# Patient Record
Sex: Male | Born: 1935 | Race: White | Hispanic: No | Marital: Single | Smoking: Never smoker
Health system: Southern US, Community
[De-identification: ages and names within clinical notes are randomized; demographics above are authoritative.]

## PROBLEM LIST (undated history)

## (undated) DIAGNOSIS — I10 Essential (primary) hypertension: Secondary | ICD-10-CM

## (undated) DIAGNOSIS — D649 Anemia, unspecified: Secondary | ICD-10-CM

## (undated) DIAGNOSIS — M1990 Unspecified osteoarthritis, unspecified site: Secondary | ICD-10-CM

## (undated) DIAGNOSIS — F32A Depression, unspecified: Secondary | ICD-10-CM

## (undated) DIAGNOSIS — H269 Unspecified cataract: Secondary | ICD-10-CM

## (undated) DIAGNOSIS — E785 Hyperlipidemia, unspecified: Secondary | ICD-10-CM

## (undated) DIAGNOSIS — F419 Anxiety disorder, unspecified: Secondary | ICD-10-CM

## (undated) DIAGNOSIS — F329 Major depressive disorder, single episode, unspecified: Secondary | ICD-10-CM

## (undated) HISTORY — PX: TONSILLECTOMY: SUR1361

## (undated) HISTORY — DX: Depression, unspecified: F32.A

## (undated) HISTORY — DX: Essential (primary) hypertension: I10

## (undated) HISTORY — DX: Unspecified cataract: H26.9

## (undated) HISTORY — DX: Unspecified osteoarthritis, unspecified site: M19.90

## (undated) HISTORY — DX: Major depressive disorder, single episode, unspecified: F32.9

## (undated) HISTORY — DX: Anxiety disorder, unspecified: F41.9

## (undated) HISTORY — PX: HERNIA REPAIR: SHX51

## (undated) HISTORY — DX: Anemia, unspecified: D64.9

## (undated) HISTORY — PX: ANTERIOR CRUCIATE LIGAMENT REPAIR: SHX115

---

## 2009-02-16 HISTORY — PX: OTHER SURGICAL HISTORY: SHX169

## 2009-05-02 ENCOUNTER — Ambulatory Visit: Payer: Self-pay | Admitting: Gastroenterology

## 2009-05-02 DIAGNOSIS — D126 Benign neoplasm of colon, unspecified: Secondary | ICD-10-CM | POA: Insufficient documentation

## 2009-05-06 ENCOUNTER — Ambulatory Visit: Payer: Self-pay | Admitting: Gastroenterology

## 2009-05-06 ENCOUNTER — Ambulatory Visit (HOSPITAL_COMMUNITY): Admission: RE | Admit: 2009-05-06 | Discharge: 2009-05-06 | Payer: Self-pay | Admitting: Gastroenterology

## 2010-03-18 NOTE — Assessment & Plan Note (Signed)
Summary: 2004 Simple Adenoma   Visit Type:  Initial Consult Referring Provider:  DonDiego Primary Care Provider:  Janna Arch, M.D.  Chief Complaint:  consult for TCS/Hx polyps.  History of Present Illness: No bleeding or change in bowel habits. First TCS 2004: simple adenoma. No weight loss or change in appetite, nause, vomiting, heratburn, indigestion, abd pain, or diarrhea.  Preventive Screening-Counseling & Management  Alcohol-Tobacco     Smoking Status: never  Current Medications (verified): 1)  Lipitor 40 Mg Tabs (Atorvastatin Calcium) .... Take 1 Tablet By Mouth Once A Day 2)  Asa 81 Mg .... Take 1 Tablet By Mouth Once A Day  Allergies (verified): No Known Drug Allergies  Past History:  Past Medical History: Hyperlipidemia 2004 TCS- 3 MM SIMPLE ADENOMA **[facility] AND [facility], [place], [place]  Past Surgical History: Knee Arthroscopy: torn cruciate ligament L hernia repair  Family History: No FH of Colon Cancer or polyps  Social History: Retired from [employer]. Was living in [place]. Single, no children. Patient has never smoked.  Alcohol Use - yes, rae: beer Smoking Status:  never  Review of Systems       Per HPI otherwise all systems negative. No problems with prep or sedation.  OCT 2010: AST 38 ALT 41 T BILI 0.7 ALK PHOS 76 ALB 4.4  Vital Signs:  Patient profile:   77 year old male Height:      69 inches Weight:      198 pounds BMI:     29.35 Temp:     97.9 degrees F oral Pulse rate:   80 / minute BP sitting:   124 / 80  (left arm) Cuff size:   large  Vitals Entered By: Cloria Spring LPN (May 02, 2009 2:56 PM)  Physical Exam  General:  Well developed, well nourished, no acute distress. Head:  Normocephalic and atraumatic. Eyes:  PERRLA, no icterus. Mouth:  No deformity or lesions. Neck:  Supple; no masses. Lungs:  Clear throughout to auscultation. Heart:  Regular rate and rhythm; no murmurs Abdomen:  Soft, nontender and nondistended.  Normal bowel sounds. Extremities:  No edema or deformities noted. Neurologic:  Alert and  oriented x4;  grossly normal neurologically.  Impression & Recommendations:  Problem # 1:  ADENOMATOUS COLONIC POLYP (ICD-211.3) Assessment Unchanged  TCS MAR 21-Trilyte. OPV as needed.   CC: PCP  Orders: New Patient Level III (04540)

## 2010-03-18 NOTE — Letter (Signed)
Summary: TCS ORDER  TCS ORDER   Imported By: Ave Filter 05/02/2009 15:24:16  _____________________________________________________________________  External Attachment:    Type:   Image     Comment:   External Document

## 2014-04-16 ENCOUNTER — Encounter: Payer: Self-pay | Admitting: Gastroenterology

## 2014-06-08 NOTE — Patient Instructions (Signed)
Your procedure is scheduled on:  Thursday, 06/14/14  Report to [facility] at       [place] [facility].  Call this number if you have problems the morning of surgery: [phone]   Remember:   Do not eat or drink   After Midnight.  Take these medicines the morning of surgery with A SIP OF WATER:    Do not wear jewelry, make-up or nail polish.  Do not wear lotions, powders, or perfumes. You may wear deodorant.  Do not bring valuables to the hospital.  Contacts, dentures or bridgework may not be worn into surgery.     Patients discharged the day of surgery will not be allowed to drive home.  Name and phone number of your driver: driver  Special Instructions: Use eye drops as directed.   Please read over the following fact sheets that you were given: Pain Booklet, Anesthesia Post-op Instructions and Care and Recovery After Surgery    Cataract Surgery  A cataract is a clouding of the lens of the eye. When a lens becomes cloudy, vision is reduced based on the degree and nature of the clouding. Surgery may be needed to improve vision. Surgery removes the cloudy lens and usually replaces it with a substitute lens (intraocular lens, IOL). LET YOUR EYE DOCTOR KNOW ABOUT:  Allergies to food or medicine.   Medicines taken including herbs, eyedrops, over-the-counter medicines, and creams.   Use of steroids (by mouth or creams).   Previous problems with anesthetics or numbing medicine.   History of bleeding problems or blood clots.   Previous surgery.   Other health problems, including diabetes and kidney problems.   Possibility of pregnancy, if this applies.  RISKS AND COMPLICATIONS  Infection.   Inflammation of the eyeball (endophthalmitis) that can spread to both eyes (sympathetic ophthalmia).   Poor wound healing.   If an IOL is inserted, it can later fall out of proper position. This is very uncommon.   Clouding of the part of your eye that holds an IOL in place. This is called an  "after-cataract." These are uncommon, but easily treated.  BEFORE THE PROCEDURE  Do not eat or drink anything except small amounts of water for 8 to 12 before your surgery, or as directed by your caregiver.   Unless you are told otherwise, continue any eyedrops you have been prescribed.   Talk to your primary caregiver about all other medicines that you take (both prescription and non-prescription). In some cases, you may need to stop or change medicines near the time of your surgery. This is most important if you are taking blood-thinning medicine.Do not stop medicines unless you are told to do so.   Arrange for someone to drive you to and from the procedure.   Do not put contact lenses in either eye on the day of your surgery.  PROCEDURE There is more than one method for safely removing a cataract. Your doctor can explain the differences and help determine which is best for you. Phacoemulsification surgery is the most common form of cataract surgery.  An injection is given behind the eye or eyedrops are given to make this a painless procedure.   A small cut (incision) is made on the edge of the clear, dome-shaped surface that covers the front of the eye (cornea).   A tiny probe is painlessly inserted into the eye. This device gives off ultrasound waves that soften and break up the cloudy center of the lens. This makes it  easier for the cloudy lens to be removed by suction.   An IOL may be implanted.   The normal lens of the eye is covered by a clear capsule. Part of that capsule is intentionally left in the eye to support the IOL.   Your surgeon may or may not use stitches to close the incision.  There are other forms of cataract surgery that require a larger incision and stiches to close the eye. This approach is taken in cases where the doctor feels that the cataract cannot be easily removed using phacoemulsification. AFTER THE PROCEDURE  When an IOL is implanted, it does not need  care. It becomes a permanent part of your eye and cannot be seen or felt.   Your doctor will schedule follow-up exams to check on your progress.   Review your other medicines with your doctor to see which can be resumed after surgery.   Use eyedrops or take medicine as prescribed by your doctor.  Document Released: 01/22/2011 Document Reviewed: 01/19/2011 [facility] Patient Information 2012 [place].  PATIENT INSTRUCTIONS POST-ANESTHESIA  IMMEDIATELY FOLLOWING SURGERY:  Do not drive or operate machinery for the first twenty four hours after surgery.  Do not make any important decisions for twenty four hours after surgery or while taking narcotic pain medications or sedatives.  If you develop intractable nausea and vomiting or a severe headache please notify your doctor immediately.  FOLLOW-UP:  Please make an appointment with your surgeon as instructed. You do not need to follow up with anesthesia unless specifically instructed to do so.  WOUND CARE INSTRUCTIONS (if applicable):  Keep a dry clean dressing on the anesthesia/puncture wound site if there is drainage.  Once the wound has quit draining you may leave it open to air.  Generally you should leave the bandage intact for twenty four hours unless there is drainage.  If the epidural site drains for more than 36-48 hours please call the anesthesia department.  QUESTIONS?:  Please feel free to call your physician or the hospital operator if you have any questions, and they will be happy to assist you.

## 2014-06-11 ENCOUNTER — Other Ambulatory Visit: Payer: Self-pay

## 2014-06-11 ENCOUNTER — Encounter (HOSPITAL_COMMUNITY): Payer: Self-pay

## 2014-06-11 ENCOUNTER — Encounter (HOSPITAL_COMMUNITY)
Admission: RE | Admit: 2014-06-11 | Discharge: 2014-06-11 | Disposition: A | Discharge status: Home / Self Care | Payer: Medicare Other | Source: Ambulatory Visit | Attending: Ophthalmology | Admitting: Ophthalmology

## 2014-06-11 DIAGNOSIS — H269 Unspecified cataract: Secondary | ICD-10-CM | POA: Diagnosis present

## 2014-06-11 DIAGNOSIS — Z79899 Other long term (current) drug therapy: Secondary | ICD-10-CM | POA: Diagnosis not present

## 2014-06-11 DIAGNOSIS — E780 Pure hypercholesterolemia: Secondary | ICD-10-CM | POA: Diagnosis not present

## 2014-06-11 DIAGNOSIS — H2511 Age-related nuclear cataract, right eye: Secondary | ICD-10-CM | POA: Diagnosis not present

## 2014-06-11 DIAGNOSIS — Z7982 Long term (current) use of aspirin: Secondary | ICD-10-CM | POA: Diagnosis not present

## 2014-06-11 HISTORY — DX: Hyperlipidemia, unspecified: E78.5

## 2014-06-11 LAB — CBC
HCT: 44.9 % (ref 39.0–52.0)
HEMOGLOBIN: 14.8 g/dL (ref 13.0–17.0)
MCH: 29.7 pg (ref 26.0–34.0)
MCHC: 33 g/dL (ref 30.0–36.0)
MCV: 90 fL (ref 78.0–100.0)
PLATELETS: 243 10*3/uL (ref 150–400)
RBC: 4.99 MIL/uL (ref 4.22–5.81)
RDW: 14.2 % (ref 11.5–15.5)
WBC: 9.4 10*3/uL (ref 4.0–10.5)

## 2014-06-11 LAB — BASIC METABOLIC PANEL
ANION GAP: 7 (ref 5–15)
BUN: 16 mg/dL (ref 6–23)
CO2: 26 mmol/L (ref 19–32)
Calcium: 9.5 mg/dL (ref 8.4–10.5)
Chloride: 108 mmol/L (ref 96–112)
Creatinine, Ser: 0.89 mg/dL (ref 0.50–1.35)
GFR calc Af Amer: >90 mL/min (ref >90)
GFR, EST NON AFRICAN AMERICAN: 80 mL/min — AB (ref >90)
Glucose, Bld: 115 mg/dL — ABNORMAL HIGH (ref 70–99)
POTASSIUM: 4.4 mmol/L (ref 3.5–5.1)
SODIUM: 141 mmol/L (ref 135–145)

## 2014-06-13 MED ORDER — PHENYLEPHRINE HCL 2.5 % OP SOLN
OPHTHALMIC | Status: AC
  Filled 2014-06-13: qty 15

## 2014-06-13 MED ORDER — CYCLOPENTOLATE-PHENYLEPHRINE OP SOLN OPTIME - NO CHARGE
OPHTHALMIC | Status: AC
  Filled 2014-06-13: qty 2

## 2014-06-13 MED ORDER — NEOMYCIN-POLYMYXIN-DEXAMETH 3.5-10000-0.1 OP SUSP
OPHTHALMIC | Status: AC
  Filled 2014-06-13: qty 5

## 2014-06-13 MED ORDER — TETRACAINE HCL 0.5 % OP SOLN
OPHTHALMIC | Status: AC
  Filled 2014-06-13: qty 2

## 2014-06-13 MED ORDER — LIDOCAINE HCL 3.5 % OP GEL
OPHTHALMIC | Status: AC
  Filled 2014-06-13: qty 1

## 2014-06-13 MED ORDER — LIDOCAINE HCL (PF) 1 % IJ SOLN
INTRAMUSCULAR | Status: AC
  Filled 2014-06-13: qty 2

## 2014-06-14 ENCOUNTER — Encounter (HOSPITAL_COMMUNITY): Payer: Self-pay | Admitting: *Deleted

## 2014-06-14 ENCOUNTER — Ambulatory Visit (HOSPITAL_COMMUNITY): Payer: Medicare Other | Admitting: Anesthesiology

## 2014-06-14 ENCOUNTER — Encounter (HOSPITAL_COMMUNITY)
Admission: RE | Disposition: A | Discharge status: Home / Self Care | Payer: Self-pay | Source: Ambulatory Visit | Attending: Ophthalmology

## 2014-06-14 ENCOUNTER — Ambulatory Visit (HOSPITAL_COMMUNITY)
Admission: RE | Admit: 2014-06-14 | Discharge: 2014-06-14 | Disposition: A | Discharge status: Home / Self Care | Payer: Medicare Other | Source: Ambulatory Visit | Attending: Ophthalmology | Admitting: Ophthalmology

## 2014-06-14 DIAGNOSIS — E780 Pure hypercholesterolemia: Secondary | ICD-10-CM | POA: Insufficient documentation

## 2014-06-14 DIAGNOSIS — H2511 Age-related nuclear cataract, right eye: Secondary | ICD-10-CM | POA: Diagnosis not present

## 2014-06-14 DIAGNOSIS — Z7982 Long term (current) use of aspirin: Secondary | ICD-10-CM | POA: Diagnosis not present

## 2014-06-14 DIAGNOSIS — Z79899 Other long term (current) drug therapy: Secondary | ICD-10-CM | POA: Diagnosis not present

## 2014-06-14 HISTORY — PX: CATARACT EXTRACTION W/PHACO: SHX586

## 2014-06-14 SURGERY — PHACOEMULSIFICATION, CATARACT, WITH IOL INSERTION
Anesthesia: Monitor Anesthesia Care | Site: Eye | Laterality: Right

## 2014-06-14 MED ORDER — PHENYLEPHRINE HCL 2.5 % OP SOLN
1.0000 [drp] | OPHTHALMIC | Status: AC
  Administered 2014-06-14 (×3): 1 [drp] via OPHTHALMIC

## 2014-06-14 MED ORDER — POVIDONE-IODINE 5 % OP SOLN
OPHTHALMIC | Status: DC | PRN
  Administered 2014-06-14: 1 via OPHTHALMIC

## 2014-06-14 MED ORDER — TETRACAINE HCL 0.5 % OP SOLN
1.0000 [drp] | OPHTHALMIC | Status: AC
  Administered 2014-06-14 (×3): 1 [drp] via OPHTHALMIC

## 2014-06-14 MED ORDER — LIDOCAINE HCL 3.5 % OP GEL
1.0000 "application " | Freq: Once | OPHTHALMIC | Status: AC
  Administered 2014-06-14: 1 via OPHTHALMIC

## 2014-06-14 MED ORDER — LACTATED RINGERS IV SOLN
INTRAVENOUS | Status: DC
Start: 2014-06-14 — End: 2014-06-14
  Administered 2014-06-14: 07:00:00 via INTRAVENOUS

## 2014-06-14 MED ORDER — CYCLOPENTOLATE-PHENYLEPHRINE 0.2-1 % OP SOLN
1.0000 [drp] | OPHTHALMIC | Status: AC
  Administered 2014-06-14 (×3): 1 [drp] via OPHTHALMIC

## 2014-06-14 MED ORDER — PROVISC 10 MG/ML IO SOLN
INTRAOCULAR | Status: DC | PRN
  Administered 2014-06-14: 0.85 mL via INTRAOCULAR

## 2014-06-14 MED ORDER — BSS IO SOLN
INTRAOCULAR | Status: DC | PRN
  Administered 2014-06-14: 15 mL

## 2014-06-14 MED ORDER — FENTANYL CITRATE (PF) 100 MCG/2ML IJ SOLN
INTRAMUSCULAR | Status: AC
  Filled 2014-06-14: qty 2

## 2014-06-14 MED ORDER — NEOMYCIN-POLYMYXIN-DEXAMETH 3.5-10000-0.1 OP SUSP
OPHTHALMIC | Status: DC | PRN
Start: 2014-06-14 — End: 2014-06-14
  Administered 2014-06-14: 2 [drp] via OPHTHALMIC

## 2014-06-14 MED ORDER — MIDAZOLAM HCL 2 MG/2ML IJ SOLN
INTRAMUSCULAR | Status: AC
  Filled 2014-06-14: qty 2

## 2014-06-14 MED ORDER — FENTANYL CITRATE (PF) 100 MCG/2ML IJ SOLN
25.0000 ug | INTRAMUSCULAR | Status: AC
  Administered 2014-06-14: 25 ug via INTRAVENOUS

## 2014-06-14 MED ORDER — EPINEPHRINE HCL 1 MG/ML IJ SOLN
INTRAMUSCULAR | Status: DC | PRN
  Administered 2014-06-14: 500 mL

## 2014-06-14 MED ORDER — EPINEPHRINE HCL 1 MG/ML IJ SOLN
INTRAMUSCULAR | Status: AC
  Filled 2014-06-14: qty 1

## 2014-06-14 MED ORDER — LIDOCAINE HCL (PF) 1 % IJ SOLN
INTRAMUSCULAR | Status: DC | PRN
  Administered 2014-06-14: .5 mL

## 2014-06-14 MED ORDER — MIDAZOLAM HCL 2 MG/2ML IJ SOLN
1.0000 mg | INTRAMUSCULAR | Status: DC | PRN
  Administered 2014-06-14: 2 mg via INTRAVENOUS

## 2014-06-14 SURGICAL SUPPLY — 33 items
CAPSULAR TENSION RING-AMO (OPHTHALMIC RELATED) IMPLANT
CLOTH BEACON ORANGE TIMEOUT ST (SAFETY) ×2 IMPLANT
EYE SHIELD UNIVERSAL CLEAR (GAUZE/BANDAGES/DRESSINGS) ×2 IMPLANT
GLOVE BIO SURGEON STRL SZ 6.5 (GLOVE) IMPLANT
GLOVE BIOGEL PI IND STRL 6.5 (GLOVE) ×1 IMPLANT
GLOVE BIOGEL PI IND STRL 7.0 (GLOVE) IMPLANT
GLOVE BIOGEL PI IND STRL 7.5 (GLOVE) IMPLANT
GLOVE BIOGEL PI INDICATOR 6.5 (GLOVE) ×1
GLOVE BIOGEL PI INDICATOR 7.0 (GLOVE)
GLOVE BIOGEL PI INDICATOR 7.5 (GLOVE)
GLOVE ECLIPSE 6.5 STRL STRAW (GLOVE) IMPLANT
GLOVE ECLIPSE 7.0 STRL STRAW (GLOVE) IMPLANT
GLOVE ECLIPSE 7.5 STRL STRAW (GLOVE) IMPLANT
GLOVE EXAM NITRILE LRG STRL (GLOVE) IMPLANT
GLOVE EXAM NITRILE MD LF STRL (GLOVE) ×2 IMPLANT
GLOVE SKINSENSE NS SZ6.5 (GLOVE)
GLOVE SKINSENSE NS SZ7.0 (GLOVE)
GLOVE SKINSENSE STRL SZ6.5 (GLOVE) IMPLANT
GLOVE SKINSENSE STRL SZ7.0 (GLOVE) IMPLANT
KIT VITRECTOMY (OPHTHALMIC RELATED) IMPLANT
PAD ARMBOARD 7.5X6 YLW CONV (MISCELLANEOUS) ×2 IMPLANT
PROC W NO LENS (INTRAOCULAR LENS)
PROC W SPEC LENS (INTRAOCULAR LENS)
PROCESS W NO LENS (INTRAOCULAR LENS) IMPLANT
PROCESS W SPEC LENS (INTRAOCULAR LENS) IMPLANT
RETRACTOR IRIS SIGHTPATH (OPHTHALMIC RELATED) IMPLANT
RING MALYGIN (MISCELLANEOUS) IMPLANT
SIGHTPATH CAT PROC W REG LENS (Ophthalmic Related) ×2 IMPLANT
SYRINGE LUER LOK 1CC (MISCELLANEOUS) ×2 IMPLANT
TAPE SURG TRANSPORE 1 IN (GAUZE/BANDAGES/DRESSINGS) ×1 IMPLANT
TAPE SURGICAL TRANSPORE 1 IN (GAUZE/BANDAGES/DRESSINGS) ×1
VISCOELASTIC ADDITIONAL (OPHTHALMIC RELATED) IMPLANT
WATER STERILE IRR 250ML POUR (IV SOLUTION) ×2 IMPLANT

## 2014-06-14 NOTE — Transfer of Care (Signed)
Immediate Anesthesia Transfer of Care Note  Patient: Shawn Gomez  Procedure(s) Performed: Procedure(s) with comments: CATARACT EXTRACTION PHACO AND INTRAOCULAR LENS PLACEMENT RIGHT EYE (Right) - CDE 5.47  Patient Location: Short Stay  Anesthesia Type:MAC  Level of Consciousness: awake, alert , oriented and patient cooperative  Airway & Oxygen Therapy: Patient Spontanous Breathing  Post-op Assessment: Report given to RN, Post -op Vital signs reviewed and stable and Patient moving all extremities  Post vital signs: Reviewed and stable  Last Vitals:  Filed Vitals:   06/14/14 0710  BP: 155/73  Pulse:   Temp:   Resp: 24    Complications: No apparent anesthesia complications

## 2014-06-14 NOTE — Op Note (Signed)
Date of Admission: 06/14/2014  Date of Surgery: 06/14/2014   Pre-Op Dx: Cataract Right Eye  Post-Op Dx: Senile Nuclear Cataract Right  Eye,  Dx Code H25.11  Surgeon: Tonny Branch, M.D.  Assistants: None  Anesthesia: Topical with MAC  Indications: Painless, progressive loss of vision with compromise of daily activities.  Surgery: Cataract Extraction with Intraocular lens Implant Right Eye  Discription: The patient had dilating drops and viscous lidocaine placed into the Right eye in the pre-op holding area. After transfer to the operating room, a time out was performed. The patient was then prepped and draped. Beginning with a 97 degree blade a paracentesis port was made at the surgeon's 2 o'clock position. The anterior chamber was then filled with 1% non-preserved lidocaine. This was followed by filling the anterior chamber with Provisc.  A 2.53mm keratome blade was used to make a clear corneal incision at the temporal limbus.  A bent cystatome needle was used to create a continuous tear capsulotomy. Hydrodissection was performed with balanced salt solution on a Fine canula. The lens nucleus was then removed using the phacoemulsification handpiece. Residual cortex was removed with the I&A handpiece. The anterior chamber and capsular bag were refilled with Provisc. A posterior chamber intraocular lens was placed into the capsular bag with it's injector. The implant was positioned with the Kuglan hook. The Provisc was then removed from the anterior chamber and capsular bag with the I&A handpiece. Stromal hydration of the main incision and paracentesis port was performed with BSS on a Fine canula. The wounds were tested for leak which was negative. The patient tolerated the procedure well. There were no operative complications. The patient was then transferred to the recovery room in stable condition.  Complications: None  Specimen: None  EBL: None  Prosthetic device: Hoya iSert 250, power 18.0 D,  SN [id number].

## 2014-06-14 NOTE — Discharge Instructions (Signed)

## 2014-06-14 NOTE — Anesthesia Postprocedure Evaluation (Signed)
  Anesthesia Post-op Note  Patient: Shawn Gomez  Procedure(s) Performed: Procedure(s) with comments: CATARACT EXTRACTION PHACO AND INTRAOCULAR LENS PLACEMENT RIGHT EYE (Right) - CDE 5.47  Patient Location: Short Stay  Anesthesia Type:MAC  Level of Consciousness: awake, alert , oriented and patient cooperative  Airway and Oxygen Therapy: Patient Spontanous Breathing  Post-op Pain: none  Post-op Assessment: Post-op Vital signs reviewed, Patient's Cardiovascular Status Stable, Respiratory Function Stable, Patent Airway, Pain level controlled and No headache  Post-op Vital Signs: Reviewed and stable  Last Vitals:  Filed Vitals:   06/14/14 0710  BP: 155/73  Pulse:   Temp:   Resp: 24    Complications: No apparent anesthesia complications

## 2014-06-14 NOTE — H&P (Signed)
I have reviewed the H&P, the patient was re-examined, and I have identified no interval changes in medical condition and plan of care since the history and physical of record  

## 2014-06-14 NOTE — Anesthesia Preprocedure Evaluation (Signed)
Anesthesia Evaluation  Patient identified by MRN, date of birth, ID band Patient awake    Reviewed: Allergy & Precautions, NPO status , Patient's Chart, lab work & pertinent test results  Airway Mallampati: II  TM Distance: >3 FB     Dental  (+) Teeth Intact   Pulmonary neg pulmonary ROS,    breath sounds clear to auscultation       Cardiovascular negative cardio ROS   Rhythm:Regular Rate:Normal     Neuro/Psych    GI/Hepatic negative GI ROS,   Endo/Other    Renal/GU      Musculoskeletal   Abdominal   Peds  Hematology   Anesthesia Other Findings   Reproductive/Obstetrics                             Anesthesia Physical Anesthesia Plan  ASA: II  Anesthesia Plan: MAC   Post-op Pain Management:    Induction: Intravenous  Airway Management Planned: Nasal Cannula  Additional Equipment:   Intra-op Plan:   Post-operative Plan:   Informed Consent: I have reviewed the patients History and Physical, chart, labs and discussed the procedure including the risks, benefits and alternatives for the proposed anesthesia with the patient or authorized representative who has indicated his/her understanding and acceptance.     Plan Discussed with:   Anesthesia Plan Comments:         Anesthesia Quick Evaluation  

## 2014-06-15 ENCOUNTER — Encounter (HOSPITAL_COMMUNITY): Payer: Self-pay | Admitting: Ophthalmology

## 2014-07-09 ENCOUNTER — Encounter (HOSPITAL_COMMUNITY): Payer: Self-pay

## 2014-07-09 ENCOUNTER — Encounter (HOSPITAL_COMMUNITY)
Admission: RE | Admit: 2014-07-09 | Discharge: 2014-07-09 | Disposition: A | Discharge status: Home / Self Care | Payer: Medicare Other | Source: Ambulatory Visit | Attending: Ophthalmology | Admitting: Ophthalmology

## 2014-07-11 MED ORDER — LIDOCAINE HCL (PF) 1 % IJ SOLN
INTRAMUSCULAR | Status: AC
  Filled 2014-07-11: qty 2

## 2014-07-11 MED ORDER — NEOMYCIN-POLYMYXIN-DEXAMETH 3.5-10000-0.1 OP SUSP
OPHTHALMIC | Status: AC
  Filled 2014-07-11: qty 5

## 2014-07-11 MED ORDER — PHENYLEPHRINE HCL 2.5 % OP SOLN
OPHTHALMIC | Status: AC
  Filled 2014-07-11: qty 15

## 2014-07-11 MED ORDER — CYCLOPENTOLATE-PHENYLEPHRINE OP SOLN OPTIME - NO CHARGE
OPHTHALMIC | Status: AC
  Filled 2014-07-11: qty 2

## 2014-07-11 MED ORDER — TETRACAINE HCL 0.5 % OP SOLN
OPHTHALMIC | Status: AC
  Filled 2014-07-11: qty 2

## 2014-07-11 MED ORDER — LIDOCAINE HCL 3.5 % OP GEL
OPHTHALMIC | Status: AC
  Filled 2014-07-11: qty 1

## 2014-07-12 ENCOUNTER — Encounter (HOSPITAL_COMMUNITY)
Admission: RE | Disposition: A | Discharge status: Home / Self Care | Payer: Self-pay | Source: Ambulatory Visit | Attending: Ophthalmology

## 2014-07-12 ENCOUNTER — Ambulatory Visit (HOSPITAL_COMMUNITY): Payer: Medicare Other | Admitting: Anesthesiology

## 2014-07-12 ENCOUNTER — Encounter (HOSPITAL_COMMUNITY): Payer: Self-pay | Admitting: *Deleted

## 2014-07-12 ENCOUNTER — Ambulatory Visit (HOSPITAL_COMMUNITY)
Admission: RE | Admit: 2014-07-12 | Discharge: 2014-07-12 | Disposition: A | Discharge status: Home / Self Care | Payer: Medicare Other | Source: Ambulatory Visit | Attending: Ophthalmology | Admitting: Ophthalmology

## 2014-07-12 DIAGNOSIS — H25812 Combined forms of age-related cataract, left eye: Secondary | ICD-10-CM | POA: Diagnosis not present

## 2014-07-12 HISTORY — PX: CATARACT EXTRACTION W/PHACO: SHX586

## 2014-07-12 SURGERY — PHACOEMULSIFICATION, CATARACT, WITH IOL INSERTION
Anesthesia: Monitor Anesthesia Care | Site: Eye | Laterality: Left

## 2014-07-12 MED ORDER — MIDAZOLAM HCL 2 MG/2ML IJ SOLN
1.0000 mg | INTRAMUSCULAR | Status: DC | PRN
  Administered 2014-07-12: 2 mg via INTRAVENOUS

## 2014-07-12 MED ORDER — PHENYLEPHRINE HCL 2.5 % OP SOLN
1.0000 [drp] | OPHTHALMIC | Status: AC
  Administered 2014-07-12 (×3): 1 [drp] via OPHTHALMIC

## 2014-07-12 MED ORDER — BSS IO SOLN
INTRAOCULAR | Status: DC | PRN
  Administered 2014-07-12: 15 mL via INTRAOCULAR

## 2014-07-12 MED ORDER — FENTANYL CITRATE (PF) 100 MCG/2ML IJ SOLN
25.0000 ug | INTRAMUSCULAR | Status: AC
  Administered 2014-07-12 (×2): 25 ug via INTRAVENOUS

## 2014-07-12 MED ORDER — LIDOCAINE HCL 3.5 % OP GEL
1.0000 "application " | Freq: Once | OPHTHALMIC | Status: AC
  Administered 2014-07-12: 1 via OPHTHALMIC

## 2014-07-12 MED ORDER — MIDAZOLAM HCL 2 MG/2ML IJ SOLN
INTRAMUSCULAR | Status: AC
  Filled 2014-07-12: qty 2

## 2014-07-12 MED ORDER — EPINEPHRINE HCL 1 MG/ML IJ SOLN
INTRAMUSCULAR | Status: AC
  Filled 2014-07-12: qty 1

## 2014-07-12 MED ORDER — CYCLOPENTOLATE-PHENYLEPHRINE 0.2-1 % OP SOLN
1.0000 [drp] | OPHTHALMIC | Status: AC
  Administered 2014-07-12 (×3): 1 [drp] via OPHTHALMIC

## 2014-07-12 MED ORDER — LIDOCAINE 3.5 % OP GEL OPTIME - NO CHARGE
OPHTHALMIC | Status: DC | PRN
  Administered 2014-07-12: 1 [drp] via OPHTHALMIC

## 2014-07-12 MED ORDER — NEOMYCIN-POLYMYXIN-DEXAMETH 3.5-10000-0.1 OP SUSP
OPHTHALMIC | Status: DC | PRN
  Administered 2014-07-12: 1 [drp] via OPHTHALMIC

## 2014-07-12 MED ORDER — FENTANYL CITRATE (PF) 100 MCG/2ML IJ SOLN
INTRAMUSCULAR | Status: AC
  Filled 2014-07-12: qty 2

## 2014-07-12 MED ORDER — PROVISC 10 MG/ML IO SOLN
INTRAOCULAR | Status: DC | PRN
  Administered 2014-07-12: 0.85 mL via INTRAOCULAR

## 2014-07-12 MED ORDER — LACTATED RINGERS IV SOLN
INTRAVENOUS | Status: DC
  Administered 2014-07-12: 11:00:00 via INTRAVENOUS

## 2014-07-12 MED ORDER — TETRACAINE HCL 0.5 % OP SOLN
1.0000 [drp] | OPHTHALMIC | Status: AC
  Administered 2014-07-12 (×3): 1 [drp] via OPHTHALMIC

## 2014-07-12 MED ORDER — LIDOCAINE HCL (PF) 1 % IJ SOLN
INTRAMUSCULAR | Status: DC | PRN
  Administered 2014-07-12: .5 mL

## 2014-07-12 MED ORDER — POVIDONE-IODINE 5 % OP SOLN
OPHTHALMIC | Status: DC | PRN
  Administered 2014-07-12: 1 via OPHTHALMIC

## 2014-07-12 MED ORDER — EPINEPHRINE HCL 1 MG/ML IJ SOLN
INTRAMUSCULAR | Status: DC | PRN
  Administered 2014-07-12: 500 mL

## 2014-07-12 SURGICAL SUPPLY — 33 items
CAPSULAR TENSION RING-AMO (OPHTHALMIC RELATED) IMPLANT
CLOTH BEACON ORANGE TIMEOUT ST (SAFETY) ×2 IMPLANT
EYE SHIELD UNIVERSAL CLEAR (GAUZE/BANDAGES/DRESSINGS) ×2 IMPLANT
GLOVE BIO SURGEON STRL SZ 6.5 (GLOVE) IMPLANT
GLOVE BIOGEL PI IND STRL 6.5 (GLOVE) IMPLANT
GLOVE BIOGEL PI IND STRL 7.0 (GLOVE) ×1 IMPLANT
GLOVE BIOGEL PI IND STRL 7.5 (GLOVE) IMPLANT
GLOVE BIOGEL PI INDICATOR 6.5 (GLOVE)
GLOVE BIOGEL PI INDICATOR 7.0 (GLOVE) ×1
GLOVE BIOGEL PI INDICATOR 7.5 (GLOVE)
GLOVE ECLIPSE 6.5 STRL STRAW (GLOVE) IMPLANT
GLOVE ECLIPSE 7.0 STRL STRAW (GLOVE) IMPLANT
GLOVE ECLIPSE 7.5 STRL STRAW (GLOVE) IMPLANT
GLOVE EXAM NITRILE LRG STRL (GLOVE) ×2 IMPLANT
GLOVE EXAM NITRILE MD LF STRL (GLOVE) IMPLANT
GLOVE SKINSENSE NS SZ6.5 (GLOVE)
GLOVE SKINSENSE NS SZ7.0 (GLOVE)
GLOVE SKINSENSE STRL SZ6.5 (GLOVE) IMPLANT
GLOVE SKINSENSE STRL SZ7.0 (GLOVE) IMPLANT
KIT VITRECTOMY (OPHTHALMIC RELATED) IMPLANT
PAD ARMBOARD 7.5X6 YLW CONV (MISCELLANEOUS) ×2 IMPLANT
PROC W NO LENS (INTRAOCULAR LENS)
PROC W SPEC LENS (INTRAOCULAR LENS)
PROCESS W NO LENS (INTRAOCULAR LENS) IMPLANT
PROCESS W SPEC LENS (INTRAOCULAR LENS) IMPLANT
RETRACTOR IRIS SIGHTPATH (OPHTHALMIC RELATED) IMPLANT
RING MALYGIN (MISCELLANEOUS) IMPLANT
SIGHTPATH CAT PROC W REG LENS (Ophthalmic Related) ×2 IMPLANT
SYRINGE LUER LOK 1CC (MISCELLANEOUS) ×2 IMPLANT
TAPE SURG TRANSPORE 1 IN (GAUZE/BANDAGES/DRESSINGS) ×1 IMPLANT
TAPE SURGICAL TRANSPORE 1 IN (GAUZE/BANDAGES/DRESSINGS) ×1
VISCOELASTIC ADDITIONAL (OPHTHALMIC RELATED) IMPLANT
WATER STERILE IRR 250ML POUR (IV SOLUTION) ×2 IMPLANT

## 2014-07-12 NOTE — Op Note (Signed)
Date of Admission: 07/12/2014  Date of Surgery: 07/12/2014   Pre-Op Dx: Cataract Left Eye  Post-Op Dx: Senile Combined Cataract Left  Eye,  Dx Code E10.071  Surgeon: Tonny Branch, M.D.  Assistants: None  Anesthesia: Topical with MAC  Indications: Painless, progressive loss of vision with compromise of daily activities.  Surgery: Cataract Extraction with Intraocular lens Implant Left Eye  Discription: The patient had dilating drops and viscous lidocaine placed into the Left eye in the pre-op holding area. After transfer to the operating room, a time out was performed. The patient was then prepped and draped. Beginning with a 68 degree blade a paracentesis port was made at the surgeon's 2 o'clock position. The anterior chamber was then filled with 1% non-preserved lidocaine. This was followed by filling the anterior chamber with Provisc.  A 2.83mm keratome blade was used to make a clear corneal incision at the temporal limbus.  A bent cystatome needle was used to create a continuous tear capsulotomy. Hydrodissection was performed with balanced salt solution on a Fine canula. The lens nucleus was then removed using the phacoemulsification handpiece. Residual cortex was removed with the I&A handpiece. The anterior chamber and capsular bag were refilled with Provisc. A posterior chamber intraocular lens was placed into the capsular bag with it's injector. The implant was positioned with the Kuglan hook. The Provisc was then removed from the anterior chamber and capsular bag with the I&A handpiece. Stromal hydration of the main incision and paracentesis port was performed with BSS on a Fine canula. The wounds were tested for leak which was negative. The patient tolerated the procedure well. There were no operative complications. The patient was then transferred to the recovery room in stable condition.  Complications: None  Specimen: None  EBL: None  Prosthetic device: Hoya iSert 250, power 18.0 D, SN  [id number].

## 2014-07-12 NOTE — Anesthesia Procedure Notes (Signed)
Procedure Name: MAC Date/Time: 07/12/2014 12:10 PM Performed by: Michele Rockers Pre-anesthesia Checklist: Patient identified, Emergency Drugs available, Suction available, Timeout performed and Patient being monitored Patient Re-evaluated:Patient Re-evaluated prior to inductionOxygen Delivery Method: Nasal Cannula

## 2014-07-12 NOTE — Anesthesia Postprocedure Evaluation (Signed)
  Anesthesia Post-op Note  Patient: Shawn Gomez  Procedure(s) Performed: Procedure(s): CATARACT EXTRACTION PHACO AND INTRAOCULAR LENS PLACEMENT: CDE:  5.63 (Left)  Patient Location: PACU and Short Stay  Anesthesia Type:MAC  Level of Consciousness: awake, alert , oriented, patient cooperative and responds to stimulation  Airway and Oxygen Therapy: Patient Spontanous Breathing  Post-op Pain: none  Post-op Assessment: Post-op Vital signs reviewed, Patient's Cardiovascular Status Stable, Respiratory Function Stable, Patent Airway, No signs of Nausea or vomiting and Pain level controlled  Post-op Vital Signs: Reviewed and stable  Last Vitals:  Filed Vitals:   07/12/14 1200  BP: 115/65  Pulse:   Temp:   Resp: 13    Complications: No apparent anesthesia complications

## 2014-07-12 NOTE — Discharge Instructions (Signed)

## 2014-07-12 NOTE — H&P (Signed)
I have reviewed the H&P, the patient was re-examined, and I have identified no interval changes in medical condition and plan of care since the history and physical of record  

## 2014-07-12 NOTE — Transfer of Care (Signed)
Immediate Anesthesia Transfer of Care Note  Patient: Shawn Gomez  Procedure(s) Performed: Procedure(s): CATARACT EXTRACTION PHACO AND INTRAOCULAR LENS PLACEMENT: CDE:  5.63 (Left)  Patient Location: Short Stay  Anesthesia Type:MAC  Level of Consciousness: awake, alert , oriented, patient cooperative and responds to stimulation  Airway & Oxygen Therapy: Patient Spontanous Breathing  Post-op Assessment: Report given to RN, Post -op Vital signs reviewed and stable and Patient moving all extremities X 4  Post vital signs: Reviewed and stable  Last Vitals:  Filed Vitals:   07/12/14 1200  BP: 115/65  Pulse:   Temp:   Resp: 13    Complications: No apparent anesthesia complications

## 2014-07-12 NOTE — Anesthesia Preprocedure Evaluation (Signed)
Anesthesia Evaluation  Patient identified by MRN, date of birth, ID band Patient awake    Reviewed: Allergy & Precautions, NPO status , Patient's Chart, lab work & pertinent test results  Airway Mallampati: II  TM Distance: >3 FB     Dental  (+) Teeth Intact   Pulmonary neg pulmonary ROS,    breath sounds clear to auscultation       Cardiovascular negative cardio ROS   Rhythm:Regular Rate:Normal     Neuro/Psych    GI/Hepatic negative GI ROS,   Endo/Other    Renal/GU      Musculoskeletal   Abdominal   Peds  Hematology   Anesthesia Other Findings   Reproductive/Obstetrics                             Anesthesia Physical Anesthesia Plan  ASA: II  Anesthesia Plan: MAC   Post-op Pain Management:    Induction: Intravenous  Airway Management Planned: Nasal Cannula  Additional Equipment:   Intra-op Plan:   Post-operative Plan:   Informed Consent: I have reviewed the patients History and Physical, chart, labs and discussed the procedure including the risks, benefits and alternatives for the proposed anesthesia with the patient or authorized representative who has indicated his/her understanding and acceptance.     Plan Discussed with:   Anesthesia Plan Comments:         Anesthesia Quick Evaluation  

## 2014-07-13 ENCOUNTER — Encounter (HOSPITAL_COMMUNITY): Payer: Self-pay | Admitting: Ophthalmology

## 2014-07-13 NOTE — Addendum Note (Signed)
Addendum  created 07/13/14 1228 by Mickel Baas, CRNA   Modules edited: Charges VN

## 2015-01-02 ENCOUNTER — Ambulatory Visit (INDEPENDENT_AMBULATORY_CARE_PROVIDER_SITE_OTHER): Payer: Medicare Other | Admitting: Urology

## 2015-01-02 DIAGNOSIS — R972 Elevated prostate specific antigen [PSA]: Secondary | ICD-10-CM | POA: Diagnosis not present

## 2015-01-02 DIAGNOSIS — N400 Enlarged prostate without lower urinary tract symptoms: Secondary | ICD-10-CM

## 2015-04-10 ENCOUNTER — Ambulatory Visit: Payer: Medicare Other | Admitting: Urology

## 2015-04-17 ENCOUNTER — Ambulatory Visit (INDEPENDENT_AMBULATORY_CARE_PROVIDER_SITE_OTHER): Payer: Medicare Other | Admitting: Urology

## 2015-04-17 DIAGNOSIS — R972 Elevated prostate specific antigen [PSA]: Secondary | ICD-10-CM | POA: Diagnosis not present

## 2015-04-17 DIAGNOSIS — N400 Enlarged prostate without lower urinary tract symptoms: Secondary | ICD-10-CM

## 2015-07-11 ENCOUNTER — Inpatient Hospital Stay (HOSPITAL_COMMUNITY): Payer: Medicare Other | Admitting: Anesthesiology

## 2015-07-11 ENCOUNTER — Inpatient Hospital Stay (HOSPITAL_COMMUNITY): Payer: Medicare Other

## 2015-07-11 ENCOUNTER — Encounter (HOSPITAL_COMMUNITY)
Admission: EM | Disposition: A | Discharge status: Skilled Nursing Facility | Payer: Self-pay | Source: Home / Self Care | Attending: Family Medicine

## 2015-07-11 ENCOUNTER — Encounter (HOSPITAL_COMMUNITY): Payer: Self-pay

## 2015-07-11 ENCOUNTER — Inpatient Hospital Stay (HOSPITAL_COMMUNITY)
Admission: EM | Admit: 2015-07-11 | Discharge: 2015-07-14 | DRG: 470 | Disposition: A | Discharge status: Skilled Nursing Facility | Payer: Medicare Other | Attending: Family Medicine | Admitting: Family Medicine

## 2015-07-11 ENCOUNTER — Emergency Department (HOSPITAL_COMMUNITY): Payer: Medicare Other

## 2015-07-11 DIAGNOSIS — D72829 Elevated white blood cell count, unspecified: Secondary | ICD-10-CM | POA: Diagnosis present

## 2015-07-11 DIAGNOSIS — S72001A Fracture of unspecified part of neck of right femur, initial encounter for closed fracture: Secondary | ICD-10-CM | POA: Diagnosis not present

## 2015-07-11 DIAGNOSIS — Z7982 Long term (current) use of aspirin: Secondary | ICD-10-CM | POA: Diagnosis not present

## 2015-07-11 DIAGNOSIS — F319 Bipolar disorder, unspecified: Secondary | ICD-10-CM | POA: Diagnosis present

## 2015-07-11 DIAGNOSIS — F0390 Unspecified dementia without behavioral disturbance: Secondary | ICD-10-CM | POA: Diagnosis not present

## 2015-07-11 DIAGNOSIS — S39012A Strain of muscle, fascia and tendon of lower back, initial encounter: Secondary | ICD-10-CM | POA: Diagnosis not present

## 2015-07-11 DIAGNOSIS — E785 Hyperlipidemia, unspecified: Secondary | ICD-10-CM | POA: Diagnosis present

## 2015-07-11 DIAGNOSIS — F428 Other obsessive-compulsive disorder: Secondary | ICD-10-CM | POA: Diagnosis present

## 2015-07-11 DIAGNOSIS — I10 Essential (primary) hypertension: Secondary | ICD-10-CM | POA: Diagnosis present

## 2015-07-11 DIAGNOSIS — Z79899 Other long term (current) drug therapy: Secondary | ICD-10-CM | POA: Diagnosis not present

## 2015-07-11 DIAGNOSIS — W010XXA Fall on same level from slipping, tripping and stumbling without subsequent striking against object, initial encounter: Secondary | ICD-10-CM | POA: Diagnosis present

## 2015-07-11 DIAGNOSIS — Z419 Encounter for procedure for purposes other than remedying health state, unspecified: Secondary | ICD-10-CM

## 2015-07-11 DIAGNOSIS — Z96641 Presence of right artificial hip joint: Secondary | ICD-10-CM

## 2015-07-11 HISTORY — PX: TOTAL HIP ARTHROPLASTY: SHX124

## 2015-07-11 LAB — CBC
HEMATOCRIT: 36.7 % — AB (ref 39.0–52.0)
Hemoglobin: 11.9 g/dL — ABNORMAL LOW (ref 13.0–17.0)
MCH: 28.7 pg (ref 26.0–34.0)
MCHC: 32.4 g/dL (ref 30.0–36.0)
MCV: 88.4 fL (ref 78.0–100.0)
PLATELETS: 263 10*3/uL (ref 150–400)
RBC: 4.15 MIL/uL — ABNORMAL LOW (ref 4.22–5.81)
RDW: 13.9 % (ref 11.5–15.5)
WBC: 18.2 10*3/uL — ABNORMAL HIGH (ref 4.0–10.5)

## 2015-07-11 LAB — CBC WITH DIFFERENTIAL/PLATELET
BASOS ABS: 0 10*3/uL (ref 0.0–0.1)
BASOS PCT: 0 %
EOS ABS: 0 10*3/uL (ref 0.0–0.7)
Eosinophils Relative: 0 %
HCT: 41.1 % (ref 39.0–52.0)
HEMOGLOBIN: 13.3 g/dL (ref 13.0–17.0)
Lymphocytes Relative: 5 %
Lymphs Abs: 1.3 10*3/uL (ref 0.7–4.0)
MCH: 28.9 pg (ref 26.0–34.0)
MCHC: 32.4 g/dL (ref 30.0–36.0)
MCV: 89.3 fL (ref 78.0–100.0)
Monocytes Absolute: 0.5 10*3/uL (ref 0.1–1.0)
Monocytes Relative: 2 %
NEUTROS PCT: 93 %
Neutro Abs: 23.7 10*3/uL — ABNORMAL HIGH (ref 1.7–7.7)
PLATELETS: 291 10*3/uL (ref 150–400)
RBC: 4.6 MIL/uL (ref 4.22–5.81)
RDW: 13.9 % (ref 11.5–15.5)
WBC: 25.5 10*3/uL — AB (ref 4.0–10.5)

## 2015-07-11 LAB — BASIC METABOLIC PANEL
Anion gap: 9 (ref 5–15)
BUN: 33 mg/dL — ABNORMAL HIGH (ref 6–20)
CALCIUM: 9.5 mg/dL (ref 8.9–10.3)
CO2: 21 mmol/L — AB (ref 22–32)
Chloride: 107 mmol/L (ref 101–111)
Creatinine, Ser: 1.26 mg/dL — ABNORMAL HIGH (ref 0.61–1.24)
GFR calc Af Amer: >60 mL/min (ref >60)
GFR, EST NON AFRICAN AMERICAN: 52 mL/min — AB (ref >60)
GLUCOSE: 127 mg/dL — AB (ref 65–99)
Potassium: 4.1 mmol/L (ref 3.5–5.1)
Sodium: 137 mmol/L (ref 135–145)

## 2015-07-11 LAB — CREATININE, SERUM
Creatinine, Ser: 1.16 mg/dL (ref 0.61–1.24)
GFR calc Af Amer: >60 mL/min (ref >60)
GFR calc non Af Amer: 58 mL/min — ABNORMAL LOW (ref >60)

## 2015-07-11 LAB — TYPE AND SCREEN
ABO/RH(D): A NEG
ANTIBODY SCREEN: NEGATIVE

## 2015-07-11 LAB — CBG MONITORING, ED: GLUCOSE-CAPILLARY: 83 mg/dL (ref 65–99)

## 2015-07-11 LAB — PROTIME-INR
INR: 1 (ref 0.00–1.49)
PROTHROMBIN TIME: 13.4 s (ref 11.6–15.2)

## 2015-07-11 LAB — SURGICAL PCR SCREEN
MRSA, PCR: NEGATIVE
Staphylococcus aureus: NEGATIVE

## 2015-07-11 SURGERY — ARTHROPLASTY, HIP, TOTAL, ANTERIOR APPROACH
Anesthesia: General | Site: Hip | Laterality: Right

## 2015-07-11 MED ORDER — FENTANYL CITRATE (PF) 100 MCG/2ML IJ SOLN
50.0000 ug | Freq: Once | INTRAMUSCULAR | Status: AC
  Administered 2015-07-11: 50 ug via INTRAVENOUS
  Filled 2015-07-11: qty 2

## 2015-07-11 MED ORDER — ASPIRIN EC 325 MG PO TBEC
325.0000 mg | DELAYED_RELEASE_TABLET | Freq: Two times a day (BID) | ORAL | Status: DC
  Administered 2015-07-12 – 2015-07-14 (×5): 325 mg via ORAL
  Filled 2015-07-11 (×5): qty 1

## 2015-07-11 MED ORDER — MORPHINE SULFATE (PF) 2 MG/ML IV SOLN: 0.5000 mg | INTRAVENOUS | Status: DC | PRN

## 2015-07-11 MED ORDER — FENTANYL CITRATE (PF) 100 MCG/2ML IJ SOLN
100.0000 ug | Freq: Once | INTRAMUSCULAR | Status: AC
  Administered 2015-07-11: 100 ug via INTRAVENOUS
  Filled 2015-07-11: qty 2

## 2015-07-11 MED ORDER — PROPOFOL 10 MG/ML IV BOLUS
INTRAVENOUS | Status: AC
  Filled 2015-07-11: qty 20

## 2015-07-11 MED ORDER — SODIUM CHLORIDE 0.9 % IV SOLN
INTRAVENOUS | Status: DC
  Administered 2015-07-12: 75 mL/h via INTRAVENOUS

## 2015-07-11 MED ORDER — SUCCINYLCHOLINE CHLORIDE 20 MG/ML IJ SOLN
INTRAMUSCULAR | Status: DC | PRN
  Administered 2015-07-11: 100 mg via INTRAVENOUS

## 2015-07-11 MED ORDER — HYDROCODONE-ACETAMINOPHEN 5-325 MG PO TABS: 1.0000 | ORAL_TABLET | Freq: Four times a day (QID) | ORAL | Status: DC | PRN

## 2015-07-11 MED ORDER — CEFAZOLIN SODIUM-DEXTROSE 2-4 GM/100ML-% IV SOLN
2.0000 g | Freq: Four times a day (QID) | INTRAVENOUS | Status: AC
  Administered 2015-07-12 (×2): 2 g via INTRAVENOUS
  Filled 2015-07-11 (×2): qty 100

## 2015-07-11 MED ORDER — METOCLOPRAMIDE HCL 5 MG PO TABS: 5.0000 mg | ORAL_TABLET | Freq: Three times a day (TID) | ORAL | Status: DC | PRN

## 2015-07-11 MED ORDER — ROCURONIUM BROMIDE 50 MG/5ML IV SOLN
INTRAVENOUS | Status: AC
  Filled 2015-07-11: qty 1

## 2015-07-11 MED ORDER — ONDANSETRON HCL 4 MG/2ML IJ SOLN
4.0000 mg | Freq: Once | INTRAMUSCULAR | Status: AC
  Administered 2015-07-11: 4 mg via INTRAVENOUS
  Filled 2015-07-11: qty 2

## 2015-07-11 MED ORDER — ONDANSETRON HCL 4 MG/2ML IJ SOLN: 4.0000 mg | Freq: Once | INTRAMUSCULAR | Status: DC | PRN

## 2015-07-11 MED ORDER — HYDROMORPHONE HCL 1 MG/ML IJ SOLN: 1.0000 mg | INTRAMUSCULAR | Status: DC | PRN

## 2015-07-11 MED ORDER — ZOLPIDEM TARTRATE 5 MG PO TABS
5.0000 mg | ORAL_TABLET | Freq: Every evening | ORAL | Status: DC | PRN
  Administered 2015-07-12 – 2015-07-13 (×2): 5 mg via ORAL
  Filled 2015-07-11 (×2): qty 1

## 2015-07-11 MED ORDER — PROPOFOL 10 MG/ML IV BOLUS
INTRAVENOUS | Status: DC | PRN
  Administered 2015-07-11: 100 mg via INTRAVENOUS
  Administered 2015-07-11: 30 mg via INTRAVENOUS
  Administered 2015-07-11: 20 mg via INTRAVENOUS

## 2015-07-11 MED ORDER — PHENYLEPHRINE HCL 10 MG/ML IJ SOLN
10.0000 mg | INTRAVENOUS | Status: DC | PRN
  Administered 2015-07-11: 50 ug/min via INTRAVENOUS

## 2015-07-11 MED ORDER — ONDANSETRON HCL 4 MG/2ML IJ SOLN: 4.0000 mg | Freq: Four times a day (QID) | INTRAMUSCULAR | Status: DC | PRN

## 2015-07-11 MED ORDER — METOCLOPRAMIDE HCL 5 MG/ML IJ SOLN: 5.0000 mg | Freq: Three times a day (TID) | INTRAMUSCULAR | Status: DC | PRN

## 2015-07-11 MED ORDER — ATORVASTATIN CALCIUM 40 MG PO TABS
40.0000 mg | ORAL_TABLET | Freq: Every day | ORAL | Status: DC
  Administered 2015-07-12 – 2015-07-14 (×3): 40 mg via ORAL
  Filled 2015-07-11 (×3): qty 1

## 2015-07-11 MED ORDER — SUGAMMADEX SODIUM 200 MG/2ML IV SOLN
INTRAVENOUS | Status: AC
  Filled 2015-07-11: qty 2

## 2015-07-11 MED ORDER — DOCUSATE SODIUM 100 MG PO CAPS: 100.0000 mg | ORAL_CAPSULE | Freq: Two times a day (BID) | ORAL | Status: DC

## 2015-07-11 MED ORDER — POVIDONE-IODINE 10 % EX SWAB: 2.0000 "application " | Freq: Once | CUTANEOUS | Status: DC

## 2015-07-11 MED ORDER — OXYCODONE HCL 5 MG PO TABS
5.0000 mg | ORAL_TABLET | ORAL | Status: DC | PRN
  Administered 2015-07-12: 5 mg via ORAL
  Filled 2015-07-11: qty 1

## 2015-07-11 MED ORDER — SUGAMMADEX SODIUM 200 MG/2ML IV SOLN
INTRAVENOUS | Status: DC | PRN
  Administered 2015-07-11: 200 mg via INTRAVENOUS

## 2015-07-11 MED ORDER — ENOXAPARIN SODIUM 40 MG/0.4ML ~~LOC~~ SOLN: 40.0000 mg | SUBCUTANEOUS | Status: DC

## 2015-07-11 MED ORDER — 0.9 % SODIUM CHLORIDE (POUR BTL) OPTIME
TOPICAL | Status: DC | PRN
  Administered 2015-07-11: 1000 mL

## 2015-07-11 MED ORDER — ASPIRIN EC 81 MG PO TBEC: 81.0000 mg | DELAYED_RELEASE_TABLET | Freq: Every day | ORAL | Status: DC

## 2015-07-11 MED ORDER — LISINOPRIL 20 MG PO TABS
20.0000 mg | ORAL_TABLET | Freq: Every day | ORAL | Status: DC
  Administered 2015-07-12: 20 mg via ORAL
  Filled 2015-07-11 (×3): qty 1

## 2015-07-11 MED ORDER — ONDANSETRON HCL 4 MG/2ML IJ SOLN
INTRAMUSCULAR | Status: DC | PRN
  Administered 2015-07-11: 4 mg via INTRAVENOUS

## 2015-07-11 MED ORDER — SUCCINYLCHOLINE CHLORIDE 200 MG/10ML IV SOSY
PREFILLED_SYRINGE | INTRAVENOUS | Status: AC
  Filled 2015-07-11: qty 10

## 2015-07-11 MED ORDER — METHOCARBAMOL 1000 MG/10ML IJ SOLN
500.0000 mg | Freq: Four times a day (QID) | INTRAVENOUS | Status: DC | PRN
  Filled 2015-07-11: qty 5

## 2015-07-11 MED ORDER — SODIUM CHLORIDE 0.9 % IV BOLUS (SEPSIS)
500.0000 mL | Freq: Once | INTRAVENOUS | Status: AC
  Administered 2015-07-11: 500 mL via INTRAVENOUS

## 2015-07-11 MED ORDER — ACETAMINOPHEN 325 MG PO TABS: 650.0000 mg | ORAL_TABLET | Freq: Four times a day (QID) | ORAL | Status: DC | PRN

## 2015-07-11 MED ORDER — METHOCARBAMOL 500 MG PO TABS: 500.0000 mg | ORAL_TABLET | Freq: Four times a day (QID) | ORAL | Status: DC | PRN

## 2015-07-11 MED ORDER — EPHEDRINE 5 MG/ML INJ
INTRAVENOUS | Status: AC
  Filled 2015-07-11: qty 10

## 2015-07-11 MED ORDER — DOCUSATE SODIUM 100 MG PO CAPS
100.0000 mg | ORAL_CAPSULE | Freq: Two times a day (BID) | ORAL | Status: DC
  Administered 2015-07-12 – 2015-07-13 (×3): 100 mg via ORAL
  Filled 2015-07-11 (×3): qty 1

## 2015-07-11 MED ORDER — LIDOCAINE HCL (CARDIAC) 20 MG/ML IV SOLN
INTRAVENOUS | Status: DC | PRN
  Administered 2015-07-11: 5 mL via INTRATRACHEAL

## 2015-07-11 MED ORDER — PHENOL 1.4 % MT LIQD: 1.0000 | OROMUCOSAL | Status: DC | PRN

## 2015-07-11 MED ORDER — CEFAZOLIN SODIUM-DEXTROSE 2-4 GM/100ML-% IV SOLN
2.0000 g | INTRAVENOUS | Status: DC
  Filled 2015-07-11: qty 100

## 2015-07-11 MED ORDER — LACTATED RINGERS IV SOLN
INTRAVENOUS | Status: DC | PRN
  Administered 2015-07-11 (×2): via INTRAVENOUS

## 2015-07-11 MED ORDER — FENTANYL CITRATE (PF) 100 MCG/2ML IJ SOLN
INTRAMUSCULAR | Status: DC | PRN
Start: 2015-07-11 — End: 2015-07-11
  Administered 2015-07-11: 100 ug via INTRAVENOUS

## 2015-07-11 MED ORDER — CHLORHEXIDINE GLUCONATE 4 % EX LIQD
60.0000 mL | Freq: Once | CUTANEOUS | Status: DC
  Filled 2015-07-11: qty 60

## 2015-07-11 MED ORDER — SODIUM CHLORIDE 0.9 % IV SOLN
INTRAVENOUS | Status: DC
Start: 2015-07-11 — End: 2015-07-12

## 2015-07-11 MED ORDER — EPHEDRINE SULFATE 50 MG/ML IJ SOLN
INTRAMUSCULAR | Status: DC | PRN
  Administered 2015-07-11: 10 mg via INTRAVENOUS

## 2015-07-11 MED ORDER — ALUM & MAG HYDROXIDE-SIMETH 200-200-20 MG/5ML PO SUSP: 30.0000 mL | ORAL | Status: DC | PRN

## 2015-07-11 MED ORDER — ONDANSETRON HCL 4 MG/2ML IJ SOLN
INTRAMUSCULAR | Status: AC
  Filled 2015-07-11: qty 6

## 2015-07-11 MED ORDER — SODIUM CHLORIDE 0.9 % IR SOLN
Status: DC | PRN
  Administered 2015-07-11: 3000 mL

## 2015-07-11 MED ORDER — FENTANYL CITRATE (PF) 100 MCG/2ML IJ SOLN
25.0000 ug | INTRAMUSCULAR | Status: DC | PRN
  Administered 2015-07-11 (×2): 50 ug via INTRAVENOUS

## 2015-07-11 MED ORDER — ONDANSETRON HCL 4 MG PO TABS: 4.0000 mg | ORAL_TABLET | Freq: Four times a day (QID) | ORAL | Status: DC | PRN

## 2015-07-11 MED ORDER — FENTANYL CITRATE (PF) 250 MCG/5ML IJ SOLN
INTRAMUSCULAR | Status: AC
  Filled 2015-07-11: qty 5

## 2015-07-11 MED ORDER — ACETAMINOPHEN 650 MG RE SUPP: 650.0000 mg | Freq: Four times a day (QID) | RECTAL | Status: DC | PRN

## 2015-07-11 MED ORDER — FENTANYL CITRATE (PF) 100 MCG/2ML IJ SOLN
INTRAMUSCULAR | Status: AC
  Filled 2015-07-11: qty 2

## 2015-07-11 MED ORDER — CEFAZOLIN SODIUM-DEXTROSE 2-3 GM-% IV SOLR
INTRAVENOUS | Status: DC | PRN
  Administered 2015-07-11: 2 g via INTRAVENOUS

## 2015-07-11 MED ORDER — MENTHOL 3 MG MT LOZG: 1.0000 | LOZENGE | OROMUCOSAL | Status: DC | PRN

## 2015-07-11 MED ORDER — ROCURONIUM BROMIDE 100 MG/10ML IV SOLN
INTRAVENOUS | Status: DC | PRN
  Administered 2015-07-11: 40 mg via INTRAVENOUS

## 2015-07-11 SURGICAL SUPPLY — 60 items
BENZOIN TINCTURE PRP APPL 2/3 (GAUZE/BANDAGES/DRESSINGS) ×3 IMPLANT
BLADE SAW SGTL 18X1.27X75 (BLADE) ×2 IMPLANT
BLADE SAW SGTL 18X1.27X75MM (BLADE) ×1
BLADE SURG ROTATE 9660 (MISCELLANEOUS) ×3 IMPLANT
CAPT HIP TOTAL 2 ×3 IMPLANT
CELLS DAT CNTRL 66122 CELL SVR (MISCELLANEOUS) ×1 IMPLANT
CLOSURE WOUND 1/2 X4 (GAUZE/BANDAGES/DRESSINGS) ×1
COVER SURGICAL LIGHT HANDLE (MISCELLANEOUS) ×3 IMPLANT
DRAPE C-ARM 42X72 X-RAY (DRAPES) ×3 IMPLANT
DRAPE STERI IOBAN 125X83 (DRAPES) ×3 IMPLANT
DRAPE U-SHAPE 47X51 STRL (DRAPES) ×9 IMPLANT
DRSG AQUACEL AG ADV 3.5X10 (GAUZE/BANDAGES/DRESSINGS) ×3 IMPLANT
DURAPREP 26ML APPLICATOR (WOUND CARE) ×3 IMPLANT
ELECT BLADE 4.0 EZ CLEAN MEGAD (MISCELLANEOUS)
ELECT BLADE 6.5 EXT (BLADE) IMPLANT
ELECT REM PT RETURN 9FT ADLT (ELECTROSURGICAL) ×3
ELECTRODE BLDE 4.0 EZ CLN MEGD (MISCELLANEOUS) IMPLANT
ELECTRODE REM PT RTRN 9FT ADLT (ELECTROSURGICAL) ×1 IMPLANT
FACESHIELD WRAPAROUND (MASK) ×6 IMPLANT
GLOVE BIOGEL PI IND STRL 6.5 (GLOVE) ×1 IMPLANT
GLOVE BIOGEL PI IND STRL 7.0 (GLOVE) ×1 IMPLANT
GLOVE BIOGEL PI IND STRL 8 (GLOVE) ×2 IMPLANT
GLOVE BIOGEL PI INDICATOR 6.5 (GLOVE) ×2
GLOVE BIOGEL PI INDICATOR 7.0 (GLOVE) ×2
GLOVE BIOGEL PI INDICATOR 8 (GLOVE) ×4
GLOVE BIOGEL PI ORTHO PRO 7.5 (GLOVE) ×2
GLOVE ECLIPSE 8.0 STRL XLNG CF (GLOVE) ×3 IMPLANT
GLOVE ORTHO TXT STRL SZ7.5 (GLOVE) ×6 IMPLANT
GLOVE PI ORTHO PRO STRL 7.5 (GLOVE) ×1 IMPLANT
GLOVE SKINSENSE NS SZ6.5 (GLOVE) ×4
GLOVE SKINSENSE NS SZ7.0 (GLOVE) ×2
GLOVE SKINSENSE STRL SZ6.5 (GLOVE) ×2 IMPLANT
GLOVE SKINSENSE STRL SZ7.0 (GLOVE) ×1 IMPLANT
GOWN STRL REUS W/ TWL LRG LVL3 (GOWN DISPOSABLE) ×3 IMPLANT
GOWN STRL REUS W/ TWL XL LVL3 (GOWN DISPOSABLE) ×3 IMPLANT
GOWN STRL REUS W/TWL LRG LVL3 (GOWN DISPOSABLE) ×6
GOWN STRL REUS W/TWL XL LVL3 (GOWN DISPOSABLE) ×6
HANDPIECE INTERPULSE COAX TIP (DISPOSABLE) ×2
KIT BASIN OR (CUSTOM PROCEDURE TRAY) ×3 IMPLANT
KIT ROOM TURNOVER OR (KITS) ×3 IMPLANT
MANIFOLD NEPTUNE II (INSTRUMENTS) ×3 IMPLANT
NS IRRIG 1000ML POUR BTL (IV SOLUTION) ×3 IMPLANT
PACK TOTAL JOINT (CUSTOM PROCEDURE TRAY) ×3 IMPLANT
PAD ARMBOARD 7.5X6 YLW CONV (MISCELLANEOUS) ×6 IMPLANT
RTRCTR WOUND ALEXIS 18CM MED (MISCELLANEOUS) ×3
SET HNDPC FAN SPRY TIP SCT (DISPOSABLE) ×1 IMPLANT
STAPLER VISISTAT 35W (STAPLE) ×3 IMPLANT
STRIP CLOSURE SKIN 1/2X4 (GAUZE/BANDAGES/DRESSINGS) ×2 IMPLANT
SUT ETHIBOND NAB CT1 #1 30IN (SUTURE) ×3 IMPLANT
SUT MNCRL AB 4-0 PS2 18 (SUTURE) ×3 IMPLANT
SUT VIC AB 0 CT1 27 (SUTURE) ×2
SUT VIC AB 0 CT1 27XBRD ANBCTR (SUTURE) ×1 IMPLANT
SUT VIC AB 1 CT1 27 (SUTURE) ×2
SUT VIC AB 1 CT1 27XBRD ANBCTR (SUTURE) ×1 IMPLANT
SUT VIC AB 2-0 CT1 27 (SUTURE) ×4
SUT VIC AB 2-0 CT1 TAPERPNT 27 (SUTURE) ×2 IMPLANT
TOWEL OR 17X24 6PK STRL BLUE (TOWEL DISPOSABLE) ×3 IMPLANT
TOWEL OR 17X26 10 PK STRL BLUE (TOWEL DISPOSABLE) ×3 IMPLANT
TRAY FOLEY CATH 16FRSI W/METER (SET/KITS/TRAYS/PACK) IMPLANT
WATER STERILE IRR 1000ML POUR (IV SOLUTION) ×6 IMPLANT

## 2015-07-11 NOTE — ED Provider Notes (Signed)
CSN: [id number]     Arrival date & time 07/11/15  1334 History   First MD Initiated Contact with Patient 07/11/15 1348     Chief Complaint  Patient presents with  . Hip Pain  . Fall     Patient is a 82 y.o. male presenting with hip pain and fall. The history is provided by the patient.  Hip Pain This is a new problem. The current episode started 3 to 5 hours ago. The problem occurs constantly. The problem has been gradually worsening. Pertinent negatives include no chest pain, no abdominal pain, no headaches and no shortness of breath. The symptoms are aggravated by walking. The symptoms are relieved by rest.  Fall This is a new problem. The current episode started 3 to 5 hours ago. Pertinent negatives include no chest pain, no abdominal pain, no headaches and no shortness of breath.  Patient with h/o hyperlipidemia presents s/p fall about 3 hrs ago He was getting laundry out of his car when he accidentally fell (denies any weakness/dizziness prior to fall) and landed on right hip  He reports pain in right hip and low back He had some numbness to right LE but that has resolved No head injury No LOC He has no other complaints  Past Medical History  Diagnosis Date  . Hyperlipidemia    Past Surgical History  Procedure Laterality Date  . Anterior cruciate ligament repair Left   . Hernia repair Left     Inguinal  . Cataract extraction w/phaco Right 06/14/2014    Procedure: CATARACT EXTRACTION PHACO AND INTRAOCULAR LENS PLACEMENT RIGHT EYE;  Surgeon: Tonny Branch, MD;  Location: [facility];  Service: Ophthalmology;  Laterality: Right;  CDE 5.47  . Cataract extraction w/phaco Left 07/12/2014    Procedure: CATARACT EXTRACTION PHACO AND INTRAOCULAR LENS PLACEMENT: CDE:  5.63;  Surgeon: Tonny Branch, MD;  Location: [facility];  Service: Ophthalmology;  Laterality: Left;   Family History  Problem Relation Age of Onset  . Stroke Mother   . Seizures Father    Social History  Substance Use Topics  .  Smoking status: Never Smoker   . Smokeless tobacco: Never Used  . Alcohol Use: Yes     Comment: Occ    Review of Systems  Constitutional: Negative for fever.  Respiratory: Negative for shortness of breath.   Cardiovascular: Negative for chest pain.  Gastrointestinal: Negative for abdominal pain.  Musculoskeletal: Positive for back pain and arthralgias. Negative for neck pain.  Neurological: Negative for headaches.  All other systems reviewed and are negative.     Allergies  Review of patient's allergies indicates no known allergies.  Home Medications   Prior to Admission medications   Medication Sig Start Date End Date Taking? Authorizing Provider  aspirin 81 MG tablet Take 81 mg by mouth daily.    Historical Provider, MD  atorvastatin (LIPITOR) 40 MG tablet Take 40 mg by mouth daily.    Historical Provider, MD  ketorolac (ACULAR) 0.5 % ophthalmic solution Place 1 drop into both eyes as directed. before procedure 06/17/14   Historical Provider, MD  ofloxacin (OCUFLOX) 0.3 % ophthalmic solution Place 1 drop into the left eye 4 (four) times daily.    Historical Provider, MD  prednisoLONE acetate (PRED FORTE) 1 % ophthalmic suspension Place 1 drop into both eyes as directed. Before procedure. 06/17/14   Historical Provider, MD   BP 150/71 mmHg  Pulse 85  Temp(Src) 98.2 F (36.8 C) (Oral)  Resp 18  Ht 5'  9" (1.753 m)  Wt 79.833 kg  BMI 25.98 kg/m2  SpO2 99% Physical Exam CONSTITUTIONAL: Well developed/well nourished HEAD: Normocephalic/atraumatic EYES: EOMI/PERRL ENMT: Mucous membranes moist NECK: supple no meningeal signs SPINE/BACK:no cervical spine tenderness CV: S1/S2 noted, no murmurs/rubs/gallops noted LUNGS: Lungs are clear to auscultation bilaterally, no apparent distress ABDOMEN: soft, nontender NEURO: Pt is awake/alert/appropriate, moves all extremitiesx4.  No facial droop.   EXTREMITIES: pulses normal/equal in feet, tenderness with ROM of right hip, All other  extremities/joints palpated/ranged and nontender SKIN: warm, color normal PSYCH: no abnormalities of mood noted, alert and oriented to situation  ED Course  Procedures  Medications  fentaNYL (SUBLIMAZE) injection 100 mcg (100 mcg Intravenous Given 07/11/15 1418)   2:39 PM Pt with right hip pain s/p fall Imaging ordered Also reports back pain, imaging ordered D/w niece, she reports pt has had increasing memory loss recently and concern for possible dementia 4:06 PM Pt with right femoral neck fx D/w dr Luna Glasgow on call for dr Aline Brochure He requests transfer to [facility] as the OR will be shut down this weekend D/w dr Gerald Stabs blackman, recommend transfer to [facility] to hospitalist service Keep patient NPO 4:30 PM D/w dr Clementeen Graham, he will arrange for transport/admission to [place] - Abnormal; Notable for the following:    CO2 21 (*)    Glucose, Bld 127 (*)    BUN 33 (*)    Creatinine, Ser 1.26 (*)    GFR calc non Af Amer 52 (*)    All other components within normal limits  CBC WITH DIFFERENTIAL/PLATELET - Abnormal; Notable for the following:    WBC 25.5 (*)    Neutro Abs 23.7 (*)    All other components within normal limits  PROTIME-INR  TYPE AND SCREEN    Imaging Review Dg Chest 1 View  07/11/2015  CLINICAL DATA:  Right hip and low back pain after slipping and falling on a wet surface outside today. EXAM: CHEST 1 VIEW COMPARISON:  None. FINDINGS: The heart size and mediastinal contours are within normal limits. Both lungs are clear. The visualized skeletal structures are unremarkable. IMPRESSION: Normal examination. Electronically Signed   By: Claudie Revering M.D.   On: 07/11/2015 15:14   Dg Lumbar Spine Complete  07/11/2015  CLINICAL DATA:  Low back and right hip pain after slipping and falling on a wet surface today. EXAM: LUMBAR SPINE - COMPLETE 4+ VIEW COMPARISON:  None. FINDINGS: Five non-rib-bearing lumbar vertebrae. Mild to moderate  lateral spur formation at multiple levels. Mild to moderate anterior spur formation at the L3-4 level. No fractures, subluxations or pars defects are seen. IMPRESSION: 1. No fracture or subluxation. 2. Degenerative changes. Electronically Signed   By: Claudie Revering M.D.   On: 07/11/2015 15:21   Dg Hip Unilat With Pelvis 2-3 Views Right  07/11/2015  CLINICAL DATA:  Right hip pain status post fall. EXAM: DG HIP (WITH OR WITHOUT PELVIS) 2-3V RIGHT COMPARISON:  None. FINDINGS: There is a mildly comminuted impacted capitellar fracture of the right femur with mild impaction. The femoral head is normally located into the acetabulum. Mild osteoarthritic changes of bilateral hips are seen. There is no evidence of pelvic fractures. IMPRESSION: Mildly comminuted impacted sub capitellar fracture of the right femoral neck. No evidence of subluxation. Electronically Signed   By: Fidela Salisbury M.D.   On: 07/11/2015 15:15   I have personally reviewed and evaluated these images and lab results as part of my  medical decision-making.   EKG Interpretation   Date/Time:  Thursday Jul 11 2015 14:17:38 EDT Ventricular Rate:  86 PR Interval:  166 QRS Duration: 92 QT Interval:  362 QTC Calculation: 433 R Axis:   61 Text Interpretation:  Sinus rhythm Atrial premature complex Borderline T  abnormalities, anterior leads Confirmed by Christy Gentles  MD, Elenore Rota ([id number]) on  07/11/2015 2:24:56 PM      MDM   Final diagnoses:  Femoral neck fracture, right, closed, initial encounter  Back strain, initial encounter    Nursing notes including past medical history and social history reviewed and considered in documentation xrays/imaging reviewed by myself and considered during evaluation Labs/vital reviewed myself and considered during evaluation     Ripley Fraise, MD 07/11/15 1630

## 2015-07-11 NOTE — Anesthesia Preprocedure Evaluation (Addendum)
Anesthesia Evaluation  Patient identified by MRN, date of birth, ID band Patient awake    Reviewed: Allergy & Precautions, NPO status , Patient's Chart, lab work & pertinent test results  Airway Mallampati: I  TM Distance: >3 FB Neck ROM: Full    Dental  (+) Teeth Intact, Dental Advisory Given   Pulmonary neg pulmonary ROS,    Pulmonary exam normal breath sounds clear to auscultation       Cardiovascular Exercise Tolerance: Good hypertension, Pt. on medications (-) angina(-) CAD, (-) Past MI and (-) CHF Normal cardiovascular exam Rhythm:Regular Rate:Normal     Neuro/Psych negative neurological ROS  negative psych ROS   GI/Hepatic negative GI ROS, Neg liver ROS,   Endo/Other  negative endocrine ROS  Renal/GU Renal InsufficiencyRenal disease     Musculoskeletal negative musculoskeletal ROS (+)   Abdominal   Peds  Hematology  (+) Blood dyscrasia, anemia ,   Anesthesia Other Findings Day of surgery medications reviewed with the patient.  Reproductive/Obstetrics negative OB ROS                            Anesthesia Physical Anesthesia Plan  ASA: II  Anesthesia Plan: General   Post-op Pain Management:    Induction: Intravenous  Airway Management Planned: Oral ETT  Additional Equipment:   Intra-op Plan:   Post-operative Plan: Extubation in OR  Informed Consent: I have reviewed the patients History and Physical, chart, labs and discussed the procedure including the risks, benefits and alternatives for the proposed anesthesia with the patient or authorized representative who has indicated his/her understanding and acceptance.   Dental advisory given  Plan Discussed with: CRNA, Anesthesiologist and Surgeon  Anesthesia Plan Comments: (Risks/benefits of general anesthesia discussed with patient including risk of damage to teeth, lips, gum, and tongue, nausea/vomiting, allergic  reactions to medications, and the possibility of heart attack, stroke and death.  All patient questions answered.  Patient wishes to proceed.)       Anesthesia Quick Evaluation

## 2015-07-11 NOTE — Consult Note (Signed)
Reason for Consult:  Left hip Fracture Referring Physician:   Forestine Na ED [facility])  Shawn Gomez is an 82 y.o. male.  HPI:   82 yo male who fractured his right hip following an accidental mechanical fall.  Was seen at [facility] and found to have a displaced right hip femoral neck fracture.  Due to no Orthopedic coverage, transfer to [facility] was requested.  Past Medical History  Diagnosis Date  . Hyperlipidemia     Past Surgical History  Procedure Laterality Date  . Anterior cruciate ligament repair Left   . Hernia repair Left     Inguinal  . Cataract extraction w/phaco Right 06/14/2014    Procedure: CATARACT EXTRACTION PHACO AND INTRAOCULAR LENS PLACEMENT RIGHT EYE;  Surgeon: Tonny Branch, MD;  Location: [facility];  Service: Ophthalmology;  Laterality: Right;  CDE 5.47  . Cataract extraction w/phaco Left 07/12/2014    Procedure: CATARACT EXTRACTION PHACO AND INTRAOCULAR LENS PLACEMENT: CDE:  5.63;  Surgeon: Tonny Branch, MD;  Location: [facility];  Service: Ophthalmology;  Laterality: Left;    Family History  Problem Relation Age of Onset  . Stroke Mother   . Seizures Father     Social History:  reports that he has never smoked. He has never used smokeless tobacco. He reports that he drinks alcohol. He reports that he does not use illicit drugs.  Allergies: No Known Allergies  Medications: I have reviewed the patient's current medications.  Results for orders placed or performed during the hospital encounter of 07/11/15 (from the past 48 hour(s))  Basic metabolic panel     Status: Abnormal   Collection Time: 07/11/15  2:25 PM  Result Value Ref Range   Sodium 137 135 - 145 mmol/L   Potassium 4.1 3.5 - 5.1 mmol/L   Chloride 107 101 - 111 mmol/L   CO2 21 (L) 22 - 32 mmol/L   Glucose, Bld 127 (H) 65 - 99 mg/dL   BUN 33 (H) 6 - 20 mg/dL   Creatinine, Ser 1.26 (H) 0.61 - 1.24 mg/dL   Calcium 9.5 8.9 - 10.3 mg/dL   GFR calc non Af Amer 52 (L) >60 mL/min   GFR calc Af  Amer >60 >60 mL/min    Comment: (NOTE) The eGFR has been calculated using the CKD EPI equation. This calculation has not been validated in all clinical situations. eGFR's persistently <60 mL/min signify possible Chronic Kidney Disease.    Anion gap 9 5 - 15  CBC WITH DIFFERENTIAL     Status: Abnormal   Collection Time: 07/11/15  2:25 PM  Result Value Ref Range   WBC 25.5 (H) 4.0 - 10.5 K/uL   RBC 4.60 4.22 - 5.81 MIL/uL   Hemoglobin 13.3 13.0 - 17.0 g/dL   HCT 41.1 39.0 - 52.0 %   MCV 89.3 78.0 - 100.0 fL   MCH 28.9 26.0 - 34.0 pg   MCHC 32.4 30.0 - 36.0 g/dL   RDW 13.9 11.5 - 15.5 %   Platelets 291 150 - 400 K/uL   Neutrophils Relative % 93 %   Lymphocytes Relative 5 %   Monocytes Relative 2 %   Eosinophils Relative 0 %   Basophils Relative 0 %   Neutro Abs 23.7 (H) 1.7 - 7.7 K/uL   Lymphs Abs 1.3 0.7 - 4.0 K/uL   Monocytes Absolute 0.5 0.1 - 1.0 K/uL   Eosinophils Absolute 0.0 0.0 - 0.7 K/uL   Basophils Absolute 0.0 0.0 - 0.1 K/uL  WBC Morphology WHITE COUNT CONFIRMED ON SMEAR     Comment: INCREASED BANDS (>20% BANDS) ATYPICAL LYMPHOCYTES   Protime-INR     Status: None   Collection Time: 07/11/15  2:25 PM  Result Value Ref Range   Prothrombin Time 13.4 11.6 - 15.2 seconds   INR 1.00 0.00 - 1.49  Type and screen [facility]     Status: None   Collection Time: 07/11/15  2:35 PM  Result Value Ref Range   ABO/RH(D) A NEG    Antibody Screen NEG    Sample Expiration 07/14/2015   CBG monitoring, ED     Status: None   Collection Time: 07/11/15  5:21 PM  Result Value Ref Range   Glucose-Capillary 83 65 - 99 mg/dL  CBC     Status: Abnormal   Collection Time: 07/11/15  6:47 PM  Result Value Ref Range   WBC 18.2 (H) 4.0 - 10.5 K/uL   RBC 4.15 (L) 4.22 - 5.81 MIL/uL   Hemoglobin 11.9 (L) 13.0 - 17.0 g/dL   HCT 36.7 (L) 39.0 - 52.0 %   MCV 88.4 78.0 - 100.0 fL   MCH 28.7 26.0 - 34.0 pg   MCHC 32.4 30.0 - 36.0 g/dL   RDW 13.9 11.5 - 15.5 %   Platelets 263 150 -  400 K/uL    Dg Chest 1 View  07/11/2015  CLINICAL DATA:  Right hip and low back pain after slipping and falling on a wet surface outside today. EXAM: CHEST 1 VIEW COMPARISON:  None. FINDINGS: The heart size and mediastinal contours are within normal limits. Both lungs are clear. The visualized skeletal structures are unremarkable. IMPRESSION: Normal examination. Electronically Signed   By: Claudie Revering M.D.   On: 07/11/2015 15:14   Dg Lumbar Spine Complete  07/11/2015  CLINICAL DATA:  Low back and right hip pain after slipping and falling on a wet surface today. EXAM: LUMBAR SPINE - COMPLETE 4+ VIEW COMPARISON:  None. FINDINGS: Five non-rib-bearing lumbar vertebrae. Mild to moderate lateral spur formation at multiple levels. Mild to moderate anterior spur formation at the L3-4 level. No fractures, subluxations or pars defects are seen. IMPRESSION: 1. No fracture or subluxation. 2. Degenerative changes. Electronically Signed   By: Claudie Revering M.D.   On: 07/11/2015 15:21   Dg Hip Unilat With Pelvis 2-3 Views Right  07/11/2015  CLINICAL DATA:  Right hip pain status post fall. EXAM: DG HIP (WITH OR WITHOUT PELVIS) 2-3V RIGHT COMPARISON:  None. FINDINGS: There is a mildly comminuted impacted capitellar fracture of the right femur with mild impaction. The femoral head is normally located into the acetabulum. Mild osteoarthritic changes of bilateral hips are seen. There is no evidence of pelvic fractures. IMPRESSION: Mildly comminuted impacted sub capitellar fracture of the right femoral neck. No evidence of subluxation. Electronically Signed   By: Fidela Salisbury M.D.   On: 07/11/2015 15:15    Review of Systems  All other systems reviewed and are negative.  Blood pressure 142/60, pulse 75, temperature 98.9 F (37.2 C), temperature source Oral, resp. rate 16, height _0  (1.753 m), weight 79.833 kg (176 lb), SpO2 100 %. Physical Exam  Constitutional: He is oriented to person, place, and time. He  appears well-developed and well-nourished.  HENT:  Head: Normocephalic and atraumatic.  Eyes: EOM are normal. Pupils are equal, round, and reactive to light.  Neck: Normal range of motion. Neck supple.  Cardiovascular: Normal rate and regular rhythm.   Respiratory: Effort  normal and breath sounds normal.  GI: Soft. Bowel sounds are normal.  Musculoskeletal:       Right hip: He exhibits decreased range of motion, decreased strength, tenderness, bony tenderness and deformity.  Neurological: He is alert and oriented to person, place, and time.  Skin: Skin is warm and dry.  Psychiatric: He has a normal mood and affect.    Assessment/Plan: Left hip with displaced femoral neck fracture 1)  I spoke to him in length about his injury and the need for surgery given the fact that the hip fracture is completely displaced.  I have recommended a hip replacement to treat this injury.  The risk and benefits of surgery have been discussed in detail and he understands fully the recommendation for surgery.  He lives alone and is very active with no previous left hip problems.  He understands that he may need skilled nursing following his acute hospital stay.  Mcarthur Rossetti 07/11/2015, 7:15 PM

## 2015-07-11 NOTE — ED Notes (Signed)
Pt states he fell on his driveway. Complain of pain in right hip

## 2015-07-11 NOTE — Transfer of Care (Signed)
Immediate Anesthesia Transfer of Care Note  Patient: Shawn Gomez  Procedure(s) Performed: Procedure(s): TOTAL HIP ARTHROPLASTY ANTERIOR APPROACH (Right)  Patient Location: PACU  Anesthesia Type:General  Level of Consciousness: sedated and patient cooperative  Airway & Oxygen Therapy: Patient connected to nasal cannula oxygen  Post-op Assessment: Report given to RN and Post -op Vital signs reviewed and stable  Post vital signs: Reviewed and stable  Last Vitals:  Filed Vitals:   07/11/15 1838 07/11/15 2145  BP: 142/60 115/61  Pulse: 75 72  Temp: 37.2 C 37.1 C  Resp:  17    Last Pain:  Filed Vitals:   07/11/15 2147  PainSc: 2          Complications: No apparent anesthesia complications

## 2015-07-11 NOTE — Brief Op Note (Signed)
07/11/2015  9:26 PM  PATIENT:  Shawn Gomez  82 y.o. male  PRE-OPERATIVE DIAGNOSIS:  Right Femoral Neck  Fracture  POST-OPERATIVE DIAGNOSIS:  Right Femoral Neck Fracture  PROCEDURE:  Procedure(s): TOTAL HIP ARTHROPLASTY ANTERIOR APPROACH (Right)  SURGEON:  Surgeon(s) and Role:    * Mcarthur Rossetti, MD - Primary  PHYSICIAN ASSISTANT: Benita Stabile, PA-C  ANESTHESIA:   general  EBL:  Total I/O In: 1000 [I.V.:1000] Out: 250 [Blood:250]  COUNTS:  YES  TOURNIQUET:  * No tourniquets in log *  DICTATION: .Other Dictation: Dictation Number [id number]  PLAN OF CARE: Admit to inpatient   PATIENT DISPOSITION:  PACU - hemodynamically stable.   Delay start of Pharmacological VTE agent (>24hrs) due to surgical blood loss or risk of bleeding: no

## 2015-07-11 NOTE — Anesthesia Procedure Notes (Signed)
Procedure Name: Intubation Date/Time: 07/11/2015 8:07 PM Performed by: Valetta Fuller Pre-anesthesia Checklist: Patient identified, Emergency Drugs available, Suction available and Patient being monitored Patient Re-evaluated:Patient Re-evaluated prior to inductionOxygen Delivery Method: Circle system utilized Preoxygenation: Pre-oxygenation with 100% oxygen Intubation Type: IV induction Ventilation: Mask ventilation without difficulty Laryngoscope Size: Miller and 2 Grade View: Grade I Tube type: Oral Tube size: 7.0 mm Number of attempts: 1 Airway Equipment and Method: Stylet Placement Confirmation: ETT inserted through vocal cords under direct vision,  positive ETCO2 and breath sounds checked- equal and bilateral Secured at: 23 cm Tube secured with: Tape Dental Injury: Teeth and Oropharynx as per pre-operative assessment

## 2015-07-11 NOTE — H&P (Signed)
TRH H&P   Patient Demographics:    Shawn Gomez, is a 82 y.o. male  MRN: [id number]   DOB - 1933/12/26  Admit Date - 07/11/2015  Outpatient Primary MD for the patient is Maricela Curet, MD  Referring MD: Dr Christy Gentles  Outpatient Specialists: none    Patient coming from:  home  Chief Complaint  Patient presents with  . Hip Pain  . Fall      HPI:    Shawn Gomez  is a 82 y.o. male, With a history of hyperlipidemia and hypertension who lives alone and is very independent presented to the [facility] ED after sustaining a mechanical fall at home. Patient walked out from his car and got his laundry to get into his apartment when he thinks he may have tripped on something and landed on his right hip. He denies loss of consciousness, feeling dizzy or lightheaded. Denies sustaining any other injury. He denies any headache, blurred vision, fever, chills, nausea, vomiting, chest pain, shortness of breath, palpitation, abdominal pain, bowel or urinary symptoms. Denies any weakness of his arms or legs, tingling or numbness.  Course in the ED Vitals were stable. Blood work showed WBC of 25.5K, normal hemoglobin and platelets. Chemistry showed BUN of 33 and creatinine 1.26, glucose of 127. X-ray of the hip showed mildly comminuted impacted so capitellar fracture of the right femoral neck. No subluxation. Chest x-ray unremarkable. EKG with normal sinus rhythm. Since [facility] would be close over the weekend Dr. Ninfa Linden consulted and requested transfer to [facility] for possible surgery later this evening or tomorrow.  Patient received her dose of IV fentanyl in the ED after which his pain was much improved.   Review of systems:    As outlined in history of present illness, 12 point review of systems otherwise unremarkable  With Past History of the following :    Past  Medical History  Diagnosis Date  . Hyperlipidemia       Past Surgical History  Procedure Laterality Date  . Anterior cruciate ligament repair Left   . Hernia repair Left     Inguinal  . Cataract extraction w/phaco Right 06/14/2014    Procedure: CATARACT EXTRACTION PHACO AND INTRAOCULAR LENS PLACEMENT RIGHT EYE;  Surgeon: Tonny Branch, MD;  Location: [facility];  Service: Ophthalmology;  Laterality: Right;  CDE 5.47  . Cataract extraction w/phaco Left 07/12/2014    Procedure: CATARACT EXTRACTION PHACO AND INTRAOCULAR LENS PLACEMENT: CDE:  5.63;  Surgeon: Tonny Branch, MD;  Location: [facility];  Service: Ophthalmology;  Laterality: Left;      Social History:     Social History  Substance Use Topics  . Smoking status: Never Smoker   . Smokeless tobacco: Never Used  . Alcohol Use: Yes     Comment: Occ     Lives - Alone  Mobility - Independent  Family History :     Family History  Problem Relation Age of Onset  . Stroke Mother   . Seizures Father       Home Medications:   Prior to Admission medications   Medication Sig Start Date End Date Taking? Authorizing Provider  aspirin EC 81 MG tablet Take 81 mg by mouth daily.   Yes Historical Provider, MD  atorvastatin (LIPITOR) 40 MG tablet Take 40 mg by mouth daily.   Yes Historical Provider, MD  lisinopril (PRINIVIL,ZESTRIL) 20 MG tablet Take 20 mg by mouth daily.   Yes Historical Provider, MD     Allergies:    No Known Allergies   Physical Exam:   Vitals  Blood pressure 136/65, pulse 79, temperature 98.2 F (36.8 C), temperature source Oral, resp. rate 16, height 5\' 9"  (1.753 m), weight 79.833 kg (176 lb), SpO2 99 %.   Gen.: Elderly male lying in bed not in distress HEENT: No pallor, moist mucosa, supple neck, no cervical lymphadenopathy  chest: Clear to auscultation bilaterally, CVS: Normal S1 and S2, no murmurs rub gallop GI: Soft, nondistended, nontender, bowel sounds present Musculoskeletal: Warm, no edema,  limited mobility of the right hip CNS: Alert and oriented   Data Review:    CBC  Recent Labs Lab 07/11/15 1425  WBC 25.5*  HGB 13.3  HCT 41.1  PLT 291  MCV 89.3  MCH 28.9  MCHC 32.4  RDW 13.9  LYMPHSABS 1.3  MONOABS 0.5  EOSABS 0.0  BASOSABS 0.0   ------------------------------------------------------------------------------------------------------------------  Chemistries   Recent Labs Lab 07/11/15 1425  NA 137  K 4.1  CL 107  CO2 21*  GLUCOSE 127*  BUN 33*  CREATININE 1.26*  CALCIUM 9.5   ------------------------------------------------------------------------------------------------------------------ estimated creatinine clearance is 47.5 mL/min (by C-G formula based on Cr of 1.26). ------------------------------------------------------------------------------------------------------------------ No results for input(s): TSH, T4TOTAL, T3FREE, THYROIDAB in the last 72 hours.  Invalid input(s): FREET3  Coagulation profile  Recent Labs Lab 07/11/15 1425  INR 1.00   ------------------------------------------------------------------------------------------------------------------- No results for input(s): DDIMER in the last 72 hours. -------------------------------------------------------------------------------------------------------------------  Cardiac Enzymes No results for input(s): CKMB, TROPONINI, MYOGLOBIN in the last 168 hours.  Invalid input(s): CK ------------------------------------------------------------------------------------------------------------------ No results found for: BNP   ---------------------------------------------------------------------------------------------------------------  Urinalysis No results found for: COLORURINE, APPEARANCEUR, LABSPEC, PHURINE, GLUCOSEU, HGBUR, BILIRUBINUR, KETONESUR, PROTEINUR, UROBILINOGEN, NITRITE,  LEUKOCYTESUR  ----------------------------------------------------------------------------------------------------------------   Imaging Results:    Dg Chest 1 View  07/11/2015  CLINICAL DATA:  Right hip and low back pain after slipping and falling on a wet surface outside today. EXAM: CHEST 1 VIEW COMPARISON:  None. FINDINGS: The heart size and mediastinal contours are within normal limits. Both lungs are clear. The visualized skeletal structures are unremarkable. IMPRESSION: Normal examination. Electronically Signed   By: Claudie Revering M.D.   On: 07/11/2015 15:14   Dg Lumbar Spine Complete  07/11/2015  CLINICAL DATA:  Low back and right hip pain after slipping and falling on a wet surface today. EXAM: LUMBAR SPINE - COMPLETE 4+ VIEW COMPARISON:  None. FINDINGS: Five non-rib-bearing lumbar vertebrae. Mild to moderate lateral spur formation at multiple levels. Mild to moderate anterior spur formation at the L3-4 level. No fractures, subluxations or pars defects are seen. IMPRESSION: 1. No fracture or subluxation. 2. Degenerative changes. Electronically Signed   By: Claudie Revering M.D.   On: 07/11/2015 15:21   Dg Hip Unilat With Pelvis 2-3 Views Right  07/11/2015  CLINICAL DATA:  Right hip pain status post fall. EXAM: DG HIP (  WITH OR WITHOUT PELVIS) 2-3V RIGHT COMPARISON:  None. FINDINGS: There is a mildly comminuted impacted capitellar fracture of the right femur with mild impaction. The femoral head is normally located into the acetabulum. Mild osteoarthritic changes of bilateral hips are seen. There is no evidence of pelvic fractures. IMPRESSION: Mildly comminuted impacted sub capitellar fracture of the right femoral neck. No evidence of subluxation. Electronically Signed   By: Fidela Salisbury M.D.   On: 07/11/2015 15:15    My personal review of EKG: Sinus rhythm rate 86, no ST-T changes   Assessment & Plan:    Principal Problem:   Femoral neck fracture, right, closed, initial  encounter Secondary to mechanical fall. Hip fracture pathway initiated on admission. Keep nothing by mouth for possible surgery tonight. Dr. Ninfa Linden consulted by ED physician. Transfer to Med[facility]urg bed at [facility]. (Signed out to my partner Dr. Charlies Silvers on the phone) Pain control with when necessary Vicodin and morphine. Added bowel regimen. Lovenox for DVT prophylaxis. -No significant cardiac history. Patient is at low to moderate cardiac risk for surgery. EKG unremarkable. Does not need further perioperative cardiac workup. Does not need perioperative beta blocker. -PT/OT postop. Will likely need skilled nursing facility as patient lives alone.  Active Problems:   Hyperlipidemia Resume statin    Essential hypertension Stable. Continue lisinopril.  Leukocytosis Possibly reactive. No clinical signs of infection. Monitor.     DVT Prophylaxis : Subcutaneous Lovenox  AM Labs Ordered, also please review Full Orders  Family Communication: Discussed plan with patient. No family at bedside  Code Status Full code  Likely DC to  : SNF upon discharge  Condition : [place] called: [facility] (Dr. Ninfa Linden)   Admission status: Inpatient  Time spent in minutes : 55   Louellen Molder M.D on 07/11/2015 at 4:55 PM  Between 7am to 7pm - Pager - [phone]. After 7pm go to [email or web address] - password [facility]  [facility] - Office  [phone]

## 2015-07-12 DIAGNOSIS — S72001A Fracture of unspecified part of neck of right femur, initial encounter for closed fracture: Principal | ICD-10-CM

## 2015-07-12 LAB — CBC WITH DIFFERENTIAL/PLATELET
BASOS ABS: 0 10*3/uL (ref 0.0–0.1)
BASOS PCT: 0 %
Eosinophils Absolute: 0.1 10*3/uL (ref 0.0–0.7)
Eosinophils Relative: 1 %
HEMATOCRIT: 33.4 % — AB (ref 39.0–52.0)
HEMOGLOBIN: 10.9 g/dL — AB (ref 13.0–17.0)
Lymphocytes Relative: 7 %
Lymphs Abs: 0.8 10*3/uL (ref 0.7–4.0)
MCH: 29.1 pg (ref 26.0–34.0)
MCHC: 32.6 g/dL (ref 30.0–36.0)
MCV: 89.1 fL (ref 78.0–100.0)
Monocytes Absolute: 0.6 10*3/uL (ref 0.1–1.0)
Monocytes Relative: 5 %
NEUTROS PCT: 87 %
Neutro Abs: 10.6 10*3/uL — ABNORMAL HIGH (ref 1.7–7.7)
Platelets: 215 10*3/uL (ref 150–400)
RBC: 3.75 MIL/uL — ABNORMAL LOW (ref 4.22–5.81)
RDW: 14.1 % (ref 11.5–15.5)
WBC: 12.1 10*3/uL — AB (ref 4.0–10.5)

## 2015-07-12 LAB — BASIC METABOLIC PANEL
ANION GAP: 6 (ref 5–15)
BUN: 21 mg/dL — AB (ref 6–20)
CALCIUM: 8.7 mg/dL — AB (ref 8.9–10.3)
CO2: 26 mmol/L (ref 22–32)
Chloride: 107 mmol/L (ref 101–111)
Creatinine, Ser: 1.17 mg/dL (ref 0.61–1.24)
GFR calc Af Amer: >60 mL/min (ref >60)
GFR, EST NON AFRICAN AMERICAN: 57 mL/min — AB (ref >60)
Glucose, Bld: 137 mg/dL — ABNORMAL HIGH (ref 65–99)
POTASSIUM: 4 mmol/L (ref 3.5–5.1)
SODIUM: 139 mmol/L (ref 135–145)

## 2015-07-12 LAB — CBC
HEMATOCRIT: 33.3 % — AB (ref 39.0–52.0)
Hemoglobin: 10.7 g/dL — ABNORMAL LOW (ref 13.0–17.0)
MCH: 28.8 pg (ref 26.0–34.0)
MCHC: 32.1 g/dL (ref 30.0–36.0)
MCV: 89.5 fL (ref 78.0–100.0)
Platelets: 207 10*3/uL (ref 150–400)
RBC: 3.72 MIL/uL — ABNORMAL LOW (ref 4.22–5.81)
RDW: 14.2 % (ref 11.5–15.5)
WBC: 12.6 10*3/uL — AB (ref 4.0–10.5)

## 2015-07-12 LAB — ABO/RH: ABO/RH(D): A NEG

## 2015-07-12 LAB — TYPE AND SCREEN
ABO/RH(D): A NEG
ANTIBODY SCREEN: NEGATIVE

## 2015-07-12 MED ORDER — OXYCODONE HCL 5 MG PO TABS
5.0000 mg | ORAL_TABLET | ORAL | Status: DC
  Administered 2015-07-12 – 2015-07-14 (×5): 5 mg via ORAL
  Filled 2015-07-12 (×5): qty 1

## 2015-07-12 MED ORDER — BUSPIRONE HCL 5 MG PO TABS
5.0000 mg | ORAL_TABLET | Freq: Two times a day (BID) | ORAL | Status: DC
  Administered 2015-07-12 – 2015-07-14 (×5): 5 mg via ORAL
  Filled 2015-07-12 (×5): qty 1

## 2015-07-12 MED ORDER — ENSURE ENLIVE PO LIQD
237.0000 mL | Freq: Every day | ORAL | Status: DC
  Administered 2015-07-12 – 2015-07-13 (×2): 237 mL via ORAL

## 2015-07-12 NOTE — Progress Notes (Signed)
CSW discussed plan with pt and pt family/HCPOAs- they are agreeable to SNF and prefer [facility] can accept pt over holiday weekend if pt is medically stable (informed of anticipated DC for Sunday)  CSW will continue to follow  Domenica Reamer, Friendly Worker [phone]

## 2015-07-12 NOTE — Anesthesia Postprocedure Evaluation (Signed)
Anesthesia Post Note  Patient: Shawn Gomez  Procedure(s) Performed: Procedure(s) (LRB): TOTAL HIP ARTHROPLASTY ANTERIOR APPROACH (Right)  Patient location during evaluation: PACU Anesthesia Type: General Level of consciousness: awake and alert Pain management: pain level controlled Vital Signs Assessment: post-procedure vital signs reviewed and stable Respiratory status: spontaneous breathing, nonlabored ventilation, respiratory function stable and patient connected to nasal cannula oxygen Cardiovascular status: blood pressure returned to baseline and stable Postop Assessment: no signs of nausea or vomiting Anesthetic complications: no    Last Vitals:  Filed Vitals:   07/12/15 0300 07/12/15 0500  BP: 130/62 117/59  Pulse: 76 76  Temp: 37.2 C 37.3 C  Resp: 16 16    Last Pain:  Filed Vitals:   07/12/15 0556  PainSc: Asleep                 Catalina Gravel

## 2015-07-12 NOTE — Op Note (Signed)
NAME[place]LAND, MINTEN NO.:  [id number]  MEDICAL RECORD NO.:  [id number]  LOCATION:  [id number]                        FACILITY:  [facility]  PHYSICIAN:  Lind Guest. Ninfa Linden, M.D.DATE OF BIRTH:  Apr 10, 1933  DATE OF PROCEDURE:  07/11/2015 DATE OF DISCHARGE:                              OPERATIVE REPORT   PREOPERATIVE DIAGNOSIS:  Displaced right hip femoral neck fracture.  POSTOPERATIVE DIAGNOSIS:  Displaced right hip femoral neck fracture.  PROCEDURE:  Right total hip arthroplasty through direct anterior approach.  IMPLANTS:  DePuy Sector Gription acetabular component size 54, size 36+ 0 polyethylene liner for a size 54 acetabular component, size 12 Corail femoral component with standard offset, size 36+ 5 ceramic hip ball.  SURGEON:  Lind Guest. Ninfa Linden, M.D.  ASSISTANT:  Erskine Emery, PA-C.  ANESTHESIA:  General.  BLOOD LOSS:  250 mL.  ANTIBIOTICS:  2 g of IV Ancef.  COMPLICATIONS:  None.  INDICATIONS:  Shawn Gomez is an 82 year old very active individual, who appears much younger than his stated age.  He lives alone.  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  He sustained a significant mechanical fall today, that was an accidental fall without a syncopal episode.  He was seen at [facility] in [place] and found to have a displaced right hip femoral neck fracture.  Since the [facility] was closing and they had no orthopedic coverage, there was request as transport him to [facility] for definitive treatment.  He was admitted to the Hospitalist Service and cleared for surgery for this evening.  I talked to him in detail about the risks and benefits of the surgery including the risk of acute blood loss anemia, nerve and vessel injury, fracture, infection, and dislocation.  He understands our goals are decreased pain, improved mobility and overall improved quality of life.  PROCEDURE DESCRIPTION:  After  informed consent was obtained, appropriate right hip was marked.  He was brought to the operating room, where general anesthesia was obtained while he was on the stretcher.  Next, traction boots were placed on both of his feet and he was placed supine on the Hana fracture table with the perineal post in place and both legs in an inline skeletal traction devices, but no traction applied.  His right operative hip was prepped and draped with DuraPrep and sterile drapes.  A time-out was called, he was identified as correct patient and correct right hip.  We then made an incision inferior and posterior to the anterior superior iliac spine and carried this obliquely down the leg.  We dissected down the tensor fascia lata muscle and tensor fascia was then divided longitudinally, so we could proceed with direct anterior approach to the hip.  We identified and cauterized the lateral femoral circumflex vessels and then identified the hip capsule.  I opened up the hip capsule in L-type format, finding a large hematoma from his hip fracture.  There was definitely completely displaced femoral neck fracture.  We then made our femoral neck cut proximal to lesser trochanter using the oscillating saw and completed this with an osteotome.  We removed the femoral head in its entirety and found it  to be broken significantly from the femoral neck fracture.  We then cleaned the acetabulum and remnants of the acetabular labrum and other debris, and began reaming under direct visualization from a size 42 reamer up to size 54 with all reamers under direct visualization and the last reamer under direct fluoroscopy, so we could obtain our depth of reaming, our inclination and anteversion.  Once we were pleased with this, we placed the real DePuy Sector Gription acetabular component size 54, a 36+ 0 polyethylene liner for that size acetabular component.  Attention was then turned to the femur.  With the leg  externally rotated to 120 degrees, extended and adducted, we were placed a bent Hohmann behind the greater trochanter and a Mueller retractor medially.  We released the lateral joint capsule and used a box cutting osteotome to enter the femoral canal and a rongeur to lateralize.  We then began broaching from a size 8 broach up to a size 12.  With the 12 in place, we trial a standard offset femoral neck and a 36+ 1.5 hip ball and brought the leg back over and up with traction and internal rotation reducing the pelvis and I felt good about stability and offset, but I felt like he could need just a little bit more offset and leg length, so we dislocated the hip and removed the trial components.  We then placed the real Corail femoral component size 12 with standard offset and the real 36+ 5 hip ball.  We reduced this in the acetabulum and we were pleased with the range of motion, offset, leg length and stability.  We then irrigated the hip with normal saline solution using pulsatile lavage.  We closed the joint capsule with interrupted #1 Ethibond suture followed by running #1 Vicryl in the tensor fascia, 0 Vicryl in the deep tissue, 2-0 Vicryl in the subcutaneous tissue, 4-0 Monocryl subcuticular stitch, and Steri-Strips on the skin.  An Aquacel dressing was then applied.  He was awakened, extubated and taken to the recovery room in stable condition. All final counts were correct.  There were no complications noted.  Of note, Erskine Emery, PA-C assisted in the entire case.  His assistance was crucial for facilitating all aspects of this case.     Lind Guest. Ninfa Linden, M.D.     CYB/MEDQ  D:  07/11/2015  T:  07/12/2015  Job:  [id number]

## 2015-07-12 NOTE — Progress Notes (Signed)
PROGRESS NOTE    Shawn Gomez  [id number] DOB: 1933/06/30 DOA: 07/11/2015 PCP: Maricela Curet, MD  Outpatient Specialists:     Brief Narrative:  82 y/o ? HLD HTN Prior history of adenomatous colonic polyp 2011 Possible undiagnosed dementia (has had confusion for the past couple of months per family)  Admitted initially from [facility] ED and transferred to [facility] after accidental fall, right hip fracture     Assessment & Plan:   Principal Problem:   Femoral neck fracture, right, closed, initial encounter Active Problems:   Hyperlipidemia   Essential hypertension   1. per discretion Ortho services-appreciate their input into   Anticoagulation post-op  Weight bearing tolerance for therapy services  Wound care  Pain management  Follow-up services required  Dementia/OCD/Bipolar Estimate at least mild dementia -informal testing bedside would correlate to MMSe ~ 22-25/30 -family relates has had reminder post-it's at home to "keep him on trakc" -suggest as OP Geri-depression scale, GAD-7 and MMSE -added buspirone for anxiety component -might try Cognitive behavioural therapy as OP? -Psychiatrist to detemrmine if Namenda might benefit [gaba-energic properties]  Htn Lisinopril 20 daily Controlled  ?  wbc Stress demargination No work-up necessary at this stage   scd Full code Discussed with 5 family member at bedside--10 min discussion in addition to 25 min composing this note and care coordination SNf soon?  Consultants:   Ortho-[facility]  Procedures:     Antimicrobials:   none    Subjective: Confused a little otherwise fine No overt pains and seems comfy  Objective: Filed Vitals:   07/11/15 2220 07/11/15 2300 07/12/15 0300 07/12/15 0500  BP: 129/59 128/60 130/62 117/59  Pulse: 75 72 76 76  Temp: 98.4 F (36.9 C) 98.4 F (36.9 C) 99 F (37.2 C) 99.2 F (37.3 C)  TempSrc:  Oral Oral Oral  Resp: 13 16 16 16   Height:       Weight:      SpO2: 96% 97% 96% 98%    Intake/Output Summary (Last 24 hours) at 07/12/15 1409 Last data filed at 07/12/15 1300  Gross per 24 hour  Intake   1300 ml  Output    575 ml  Net    725 ml   Filed Weights   07/11/15 1335 07/11/15 1340  Weight: 79.379 kg (175 lb) 79.833 kg (176 lb)    Examination:  General exam: Appears.  Calm Slightly confused Respiratory system: Clear to auscultation. Respiratory effort normal. Cardiovascular system: S1 & S2 heard, RRR. No JVD Gastrointestinal system: Abdomen is nondistended, soft and nontender. No organomegaly or masses felt. Normal bowel sounds heard. Central nervous system: No focal neurological deficits. Extremities: Symmetric 5 x 5 power.  Wound not visualised    Data Reviewed: I have personally reviewed following labs and imaging studies  CBC:  Recent Labs Lab 07/11/15 1425 07/11/15 1847 07/12/15 0617 07/12/15 0818  WBC 25.5* 18.2* 12.6* 12.1*  NEUTROABS 23.7*  --   --  10.6*  HGB 13.3 11.9* 10.7* 10.9*  HCT 41.1 36.7* 33.3* 33.4*  MCV 89.3 88.4 89.5 89.1  PLT 291 263 207 [id number]   Basic Metabolic Panel:  Recent Labs Lab 07/11/15 1425 07/11/15 1847 07/12/15 0617  NA 137  --  139  K 4.1  --  4.0  CL 107  --  107  CO2 21*  --  26  GLUCOSE 127*  --  137*  BUN 33*  --  21*  CREATININE 1.26* 1.16 1.17  CALCIUM 9.5  --  8.7*   GFR: Estimated Creatinine Clearance: 51.2 mL/min (by C-G formula based on Cr of 1.17). Liver Function Tests: No results for input(s): AST, ALT, ALKPHOS, BILITOT, PROT, ALBUMIN in the last 168 hours. No results for input(s): LIPASE, AMYLASE in the last 168 hours. No results for input(s): AMMONIA in the last 168 hours. Coagulation Profile:  Recent Labs Lab 07/11/15 1425  INR 1.00   Cardiac Enzymes: No results for input(s): CKTOTAL, CKMB, CKMBINDEX, TROPONINI in the last 168 hours. BNP (last 3 results) No results for input(s): PROBNP in the last 8760 hours. HbA1C: No results  for input(s): HGBA1C in the last 72 hours. CBG:  Recent Labs Lab 07/11/15 1721  GLUCAP 83   Lipid Profile: No results for input(s): CHOL, HDL, LDLCALC, TRIG, CHOLHDL, LDLDIRECT in the last 72 hours. Thyroid Function Tests: No results for input(s): TSH, T4TOTAL, FREET4, T3FREE, THYROIDAB in the last 72 hours. Anemia Panel: No results for input(s): VITAMINB12, FOLATE, FERRITIN, TIBC, IRON, RETICCTPCT in the last 72 hours. Urine analysis: No results found for: COLORURINE, APPEARANCEUR, LABSPEC, PHURINE, GLUCOSEU, HGBUR, BILIRUBINUR, KETONESUR, PROTEINUR, UROBILINOGEN, NITRITE, LEUKOCYTESUR Sepsis Labs: @LABRCNTIP (procalcitonin:4,lacticidven:4)  ) Recent Results (from the past 240 hour(s))  Surgical pcr screen     Status: None   Collection Time: 07/11/15  7:27 PM  Result Value Ref Range Status   MRSA, PCR NEGATIVE NEGATIVE Final   Staphylococcus aureus NEGATIVE NEGATIVE Final    Comment:        The Xpert SA Assay (FDA approved for NASAL specimens in patients over 72 years of age), is one component of a comprehensive surveillance program.  Test performance has been validated by [facility] for patients greater than or equal to 70 year old. It is not intended to diagnose infection nor to guide or monitor treatment.          Radiology Studies: Dg Chest 1 View  07/11/2015  CLINICAL DATA:  Right hip and low back pain after slipping and falling on a wet surface outside today. EXAM: CHEST 1 VIEW COMPARISON:  None. FINDINGS: The heart size and mediastinal contours are within normal limits. Both lungs are clear. The visualized skeletal structures are unremarkable. IMPRESSION: Normal examination. Electronically Signed   By: Claudie Revering M.D.   On: 07/11/2015 15:14   Dg Lumbar Spine Complete  07/11/2015  CLINICAL DATA:  Low back and right hip pain after slipping and falling on a wet surface today. EXAM: LUMBAR SPINE - COMPLETE 4+ VIEW COMPARISON:  None. FINDINGS: Five  non-rib-bearing lumbar vertebrae. Mild to moderate lateral spur formation at multiple levels. Mild to moderate anterior spur formation at the L3-4 level. No fractures, subluxations or pars defects are seen. IMPRESSION: 1. No fracture or subluxation. 2. Degenerative changes. Electronically Signed   By: Claudie Revering M.D.   On: 07/11/2015 15:21   Pelvis Portable  07/11/2015  CLINICAL DATA:  Status post right hip arthroplasty. Initial encounter. EXAM: PORTABLE PELVIS 1-2 VIEWS COMPARISON:  None. FINDINGS: The patient is status post right hip arthroplasty. There is no evidence of fracture or loosening. Scattered postoperative soft tissue air is seen on the right. The left hip joint is grossly unremarkable. IMPRESSION: Status post right hip arthroplasty. No evidence of fracture or loosening. Electronically Signed   By: Garald Balding M.D.   On: 07/11/2015 23:04   Dg Hip Operative Unilat With Pelvis Right  07/11/2015  CLINICAL DATA:  Right hip replacement. EXAM: OPERATIVE RIGHT HIP (WITH PELVIS IF PERFORMED) 4 VIEWS TECHNIQUE: Fluoroscopic spot image(s)  were submitted for interpretation post-operatively. COMPARISON:  None. FINDINGS: Four fluoroscopic spot images are provided from intraoperative right hip arthroplasty. Hardware appears appropriately positioned. No evidence of surgical complicating feature. IMPRESSION: Fluoroscopic spot images status post right hip arthroplasty. Hardware appears appropriately positioned. Fluoroscopy provided for 38 seconds. Electronically Signed   By: Franki Cabot M.D.   On: 07/11/2015 21:39   Dg Hip Unilat With Pelvis 2-3 Views Right  07/11/2015  CLINICAL DATA:  Right hip pain status post fall. EXAM: DG HIP (WITH OR WITHOUT PELVIS) 2-3V RIGHT COMPARISON:  None. FINDINGS: There is a mildly comminuted impacted capitellar fracture of the right femur with mild impaction. The femoral head is normally located into the acetabulum. Mild osteoarthritic changes of bilateral hips are seen.  There is no evidence of pelvic fractures. IMPRESSION: Mildly comminuted impacted sub capitellar fracture of the right femoral neck. No evidence of subluxation. Electronically Signed   By: Fidela Salisbury M.D.   On: 07/11/2015 15:15        Scheduled Meds: . aspirin EC  325 mg Oral BID PC  . atorvastatin  40 mg Oral Daily  . busPIRone  5 mg Oral BID  . docusate sodium  100 mg Oral BID  . enoxaparin (LOVENOX) injection  40 mg Subcutaneous Q24H  . feeding supplement (ENSURE ENLIVE)  237 mL Oral Q1500  . lisinopril  20 mg Oral Daily   Continuous Infusions: . sodium chloride    . sodium chloride       LOS: 1 day    Time spent: [place], MD [facility]   If 7PM-7AM, please contact night-coverage [email or web address] Password [email or web address] 07/12/2015, 2:09 PM

## 2015-07-12 NOTE — Progress Notes (Signed)
OT Cancellation Note  Patient Details Name: DMARI HARLING MRN: [id number] DOB: 1933/09/18   Cancelled Treatment:    Reason Eval/Treat Not Completed: Other (comment) (Plan is for pt to d/c to SNF, will defer OT to next venue). Please re-consult if needs change. Thank you for this referral.  Binnie Kand M.S., OTR/L Pager: [phone]  07/12/2015, 1:36 PM

## 2015-07-12 NOTE — Progress Notes (Signed)
Subjective: 1 Day Post-Op Procedure(s) (LRB): TOTAL HIP ARTHROPLASTY ANTERIOR APPROACH (Right) Patient reports pain as moderate.    Objective: Vital signs in last 24 hours: Temp:  [98.2 F (36.8 C)-99.2 F (37.3 C)] 99.2 F (37.3 C) (05/26 0500) Pulse Rate:  [69-87] 76 (05/26 0500) Resp:  [13-21] 16 (05/26 0500) BP: (115-150)/(59-93) 117/59 mmHg (05/26 0500) SpO2:  [94 %-100 %] 98 % (05/26 0500) Weight:  [79.379 kg (175 lb)-79.833 kg (176 lb)] 79.833 kg (176 lb) (05/25 1340)  Intake/Output from previous day: 05/25 0701 - 05/26 0700 In: 1300 [I.V.:1300] Out: 375 [Urine:125; Blood:250] Intake/Output this shift: Total I/O In: 1300 [I.V.:1300] Out: 375 [Urine:125; Blood:250]   Recent Labs  07/11/15 1425 07/11/15 1847  HGB 13.3 11.9*    Recent Labs  07/11/15 1425 07/11/15 1847  WBC 25.5* 18.2*  RBC 4.60 4.15*  HCT 41.1 36.7*  PLT 291 263    Recent Labs  07/11/15 1425 07/11/15 1847  NA 137  --   K 4.1  --   CL 107  --   CO2 21*  --   BUN 33*  --   CREATININE 1.26* 1.16  GLUCOSE 127*  --   CALCIUM 9.5  --     Recent Labs  07/11/15 1425  INR 1.00    Sensation intact distally Intact pulses distally Dorsiflexion/Plantar flexion intact Incision: scant drainage  Assessment/Plan: 1 Day Post-Op Procedure(s) (LRB): TOTAL HIP ARTHROPLASTY ANTERIOR APPROACH (Right) Up with therapy - WBAT right hip Will need SNF placement  Shawn Gomez Y 07/12/2015, 6:48 AM

## 2015-07-12 NOTE — Evaluation (Signed)
Physical Therapy Evaluation Patient Details Name: Shawn Gomez MRN: [id number] DOB: 18-May-1933 Today's Date: 07/12/2015   History of Present Illness  82 yo male  admitted from  [place] ED after mechanical fall and susatained supcapital R hip fracture on 07/11/15. Patient independent PTA. S/P direct anterior  THA on 11/11/15.  Clinical Impression  The patient is pleasantly confused, multimodal cues for redirection for mobility requiring much extra time  To follow through with activity of  Mobilizing from bed to recliner.The patient Perseverating on how to dial the phone and on adjusting the recliner back and foot rest. The patient also used the urinal 3 times in course of 45 minutes.   The patient will benefit from  PT while in acute care to address the problems listed in the note below to DC to next venue.   Follow Up Recommendations SNF;Supervision/Assistance - 24 hour    Equipment Recommendations  None recommended by PT    Recommendations for Other Services       Precautions / Restrictions Precautions Precautions: Fall Precaution Comments: confusion Restrictions RLE Weight Bearing: Weight bearing as tolerated      Mobility  Bed Mobility Overal bed mobility: Needs Assistance Bed Mobility: Supine to Sit     Supine to sit: Mod assist     General bed mobility comments: multimodal cues  for trunk, self assist using the bed sheet to slide Right leg to edge, cues for use of rail to self assist trunk to upright  Transfers Overall transfer level: Needs assistance Equipment used: Rolling walker (2 wheeled) Transfers: Sit to/from [facility] Sit to Stand: Max assist;+2 safety/equipment;From elevated surface Stand pivot transfers: Max assist;+2 safety/equipment;Mod assist       General transfer comment: multimodal cues for attempts to stand from the bed x 3 with inability to power up to stand.  On 3rd trial, patient able to stand and support self in RW with  multimodal cues to push down ito RW and mod/max assist. At times, RW lifted off the ground. Gradual small steps to recliner with mod/max assist and multimodal cues to reach back to recliner  armrests. Much extra time for activity due to decreased following commands for hand placemnt and use of RW.  Ambulation/Gait                Stairs            Wheelchair Mobility    Modified Rankin (Stroke Patients Only)       Balance Overall balance assessment: History of Falls;Needs assistance Sitting-balance support: Bilateral upper extremity supported;Feet supported Sitting balance-Leahy Scale: Fair     Standing balance support: During functional activity;Bilateral upper extremity supported Standing balance-Leahy Scale: Poor                               Pertinent Vitals/Pain Pain Assessment: Faces Faces Pain Scale: Hurts little more Pain Location:  points to right thigh Pain Descriptors / Indicators: Discomfort;Guarding Pain Intervention(s): Limited activity within patient's tolerance;Monitored during session;Ice applied    Home Living Family/patient expects to be discharged to:: Skilled nursing facility                 Additional Comments: lived alone    Prior Function Level of Independence: Independent               Hand Dominance        Extremity/Trunk Assessment   Upper Extremity Assessment:  Overall WFL for tasks assessed           Lower Extremity Assessment: RLE deficits/detail RLE Deficits / Details: decreased weight supported during initial standing, then able to bear weight to  take a few steps to recliner.    Cervical / Trunk Assessment: Kyphotic  Communication   Communication: No difficulties  Cognition Arousal/Alertness: Awake/alert Behavior During Therapy: Restless Overall Cognitive Status: Impaired/Different from baseline Area of Impairment: Orientation;Memory;Safety/judgement;Problem solving Orientation Level:  Time   Memory: Decreased recall of precautions   Safety/Judgement: Decreased awareness of safety   Problem Solving: Difficulty sequencing General Comments: perseverating on instructions for operating the recliner, multiple times moved foot of chair up and down, moved back of recliner  forward and backward. perseverated on  how to dial the phone.    General Comments      Exercises General Exercises - Lower Extremity Heel Slides: AAROM;Right;10 reps;Supine      Assessment/Plan    PT Assessment Patient needs continued PT services  PT Diagnosis Difficulty walking;Generalized weakness;Acute pain;Altered mental status   PT Problem List Decreased strength;Decreased range of motion;Decreased activity tolerance;Decreased balance;Decreased mobility;Decreased cognition;Decreased knowledge of precautions;Decreased safety awareness;Decreased knowledge of use of DME;Pain  PT Treatment Interventions DME instruction;Functional mobility training;Therapeutic activities;Therapeutic exercise;Patient/family education;Cognitive remediation   PT Goals (Current goals can be found in the Care Plan section) Acute Rehab PT Goals Patient Stated Goal: per Carla-He can't go home-agreed with rehab. PT Goal Formulation: With patient/family Time For Goal Achievement: 07/26/15 Potential to Achieve Goals: Good    Frequency Min 3X/week   Barriers to discharge Decreased caregiver support      Co-evaluation               End of Session   Activity Tolerance: Patient tolerated treatment well Patient left: in chair;with chair alarm set;with call bell/phone within reach Nurse Communication: Mobility status         Time: [id number] PT Time Calculation (min) (ACUTE ONLY): 53 min   Charges:   PT Evaluation $PT Eval Low Complexity: 1 Procedure PT Treatments $Therapeutic Exercise: 8-22 mins $Therapeutic Activity: 23-37 mins   PT G Codes:        Claretha Cooper 07/12/2015, 10:12 AM Tresa Endo PT [phone]

## 2015-07-12 NOTE — Care Management Note (Signed)
Case Management Note  Patient Details  Name: Shawn Gomez MRN: [id number] Date of Birth: [birth year]  Subjective/Objective:                Admitted with right femoral neck fracture,s/p right total hip arthroplasty    Action/Plan: PT recommending SNF. Referral made to CSW, [place] working on SNF placement for short term rehab.  Expected Discharge Date:                  Expected Discharge Plan:  Skilled Nursing Facility  In-House Referral:  Clinical Social Work  Discharge planning Services  CM Consult  Post Acute Care Choice:    Choice offered to:     DME Arranged:    DME Agency:     HH Arranged:    [place] Agency:     Status of Service:  In process, will continue to follow  Medicare Important Message Given:    Date Medicare IM Given:    Medicare IM give by:    Date Additional Medicare IM Given:    Additional Medicare Important Message give by:     If discussed at [place] of Stay Meetings, dates discussed:    Additional Comments:  Nila Nephew, RN 07/12/2015, 3:11 PM

## 2015-07-12 NOTE — Progress Notes (Signed)
Initial Nutrition Assessment  DOCUMENTATION CODES:   Not applicable  INTERVENTION:  Provide Ensure Enlive po once daily, each supplement provides 350 kcal and 20 grams of protein.  Provide nourishment snacks. (Ordered)  Encourage adequate PO intake.   NUTRITION DIAGNOSIS:   Increased nutrient needs related to  (s/p surgery) as evidenced by estimated needs.  GOAL:   Patient will meet greater than or equal to 90% of their needs  MONITOR:   PO intake, Supplement acceptance, Weight trends, Labs, I & O's  REASON FOR ASSESSMENT:   Consult Assessment of nutrition requirement/status  ASSESSMENT:   82 y.o. male, With a history of hyperlipidemia and hypertension who lives alone and is very independent presented after sustaining a mechanical fall at home. Found to have a displaced right hip femoral neck fracture.  Procedure(5/25): TOTAL HIP ARTHROPLASTY ANTERIOR APPROACH (Right)  Pt reports having a good appetite currently and PTA with no other difficulties. Pt was consuming his breakfast during time of visit. He reports it is his first meal since admission. Usual body weight unknown. No significant weight loss per Epic weight records. Pt is agreeable to Ensure to aid in caloric and protein needs as well as in healing. Pt and family additionally requested nourishment snacks. RD to order.   Pt with no observed significant fat or muscle mass loss.   Labs and medications reviewed.  Diet Order:  Diet regular Room service appropriate?: Yes; Fluid consistency:: Thin  Skin:   (Incision on R hip)  Last BM:  Unknown  Height:   Ht Readings from Last 1 Encounters:  07/11/15 5\' 9"  (1.753 m)    Weight:   Wt Readings from Last 1 Encounters:  07/11/15 176 lb (79.833 kg)    Ideal Body Weight:  72.72 kg  BMI:  Body mass index is 25.98 kg/(m^2).  Estimated Nutritional Needs:   Kcal:  [id number]  Protein:  85-95 grams  Fluid:  1.9 - 2 L/day  EDUCATION NEEDS:   No  education needs identified at this time  Corrin Parker, MS, RD, LDN Pager # [phone] After hours/ weekend pager # [phone]

## 2015-07-12 NOTE — Clinical Social Work Note (Signed)
Clinical Social Work Assessment  Patient Details  Name: Shawn Gomez MRN: [id number] Date of Birth: 1933/05/30  Date of referral:  07/12/15               Reason for consult:  Facility Placement                Permission sought to share information with:  Family Supports Permission granted to share information::  Yes, Verbal Permission Granted  Name::     Angela Nevin and Santiago Glad  Agency::  [facility] SNFs  Relationship::  family/HCPOAs  Contact Information:     Housing/Transportation Living arrangements for the past 2 months:  Apartment Source of Information:  Patient, Other (Comment Required) (niece/sister) Patient Interpreter Needed:  None Criminal Activity/Legal Involvement Pertinent to Current Situation/Hospitalization:  No - Comment as needed Significant Relationships:  Other Family Members, Siblings Lives with:  Self Do you feel safe going back to the place where you live?  No (not with current impairment) Need for family participation in patient care:  Yes (Comment) (some help with discharge planning)  Care giving concerns:  Pt lives in an apartment alone and does not have access to 24 hour caregiving at home.   Social Worker assessment / plan:  CSW spoke with pt first concerning SNF placement- PT had not evaluated pt yet so pt felt unsure about what he would want to do but was open to whatever was decided by medical staff and family.  CSW spoke with pt HCPOAs Angela Nevin and Santiago Glad) who are very much in favor of SNF- prefer [facility].  They also voiced concerns about pt long term safety living alone due to recent decline in cognitive abilities (3-6 months)- state pt stories are inconsistent and pt consistently making poor choices (walking on busy roads, unsure of meal plan etc).    CSW provided pt HCPOAs with list of ALFs and informed of need to consider Medicaid to help pay if pt did not have financial means. CSW also discussed potential of this process taking some time and  encouraged an interim plan be made- pt niece, Angela Nevin, states pt can live with her while they figure out next steps.  Employment status:  Retired Forensic scientist:  Medicare PT Recommendations:  [place] / Referral to community resources:  [place]  Patient/Family's Response to care: Pt HCPOAs are greatful for CSW involvement and agreeable to plan for SNF while they consider alternative options for pt living.  CSW also discussed with pt who is agreeable to SNF stay after surgery.  Patient/Family's Understanding of and Emotional Response to Diagnosis, Current Treatment, and Prognosis:  Pt family very involved and understanding of pt condition- hopeful they can find safe living environment for pt when ready to leave rehab.  Emotional Assessment Appearance:  Appears stated age Attitude/Demeanor/Rapport:    Affect (typically observed):  Appropriate, Pleasant Orientation:  Oriented to  Time, Oriented to Situation, Oriented to Place, Oriented to Self (some confusion/memory impairment) Alcohol / Substance use:  Not Applicable Psych involvement (Current and /or in the community):  No (Comment)  Discharge Needs  Concerns to be addressed:  Home Safety Concerns, Care Coordination, Cognitive Concerns Readmission within the last 30 days:  No Current discharge risk:  Lives alone, Physical Impairment Barriers to Discharge:  Continued Medical Work up   [employer], LCSW 07/12/2015, 12:50 PM

## 2015-07-12 NOTE — NC FL2 (Signed)
[place] LEVEL OF CARE SCREENING TOOL     IDENTIFICATION  Patient Name: Shawn Gomez Birthdate: February 21, 1933 Sex: male Admission Date (Current Location): 07/11/2015  [facility] and [place] Number:  [employer] and Address:  [facility] [place], [place] [address], [place], [place] [address]      Provider Number: [id number]  Attending Physician Name and Address:  Nita Sells, MD  Relative Name and Phone Number:       Current Level of Care: Hospital Recommended Level of Care: [place] Prior Approval Number:    Date Approved/Denied:   PASRR Number: [id number]  Discharge Plan: SNF    Current Diagnoses: Patient Active Problem List   Diagnosis Date Noted  . Femoral neck fracture, right, closed, initial encounter 07/11/2015  . Hyperlipidemia 07/11/2015  . Essential hypertension 07/11/2015  . Leucocytosis   . ADENOMATOUS COLONIC POLYP 05/02/2009    Orientation RESPIRATION BLADDER Height & Weight     Self, Time, Situation, Place  O2 (2L [place]) Continent Weight: 176 lb (79.833 kg) Height:  5\' 9"  (175.3 cm)  BEHAVIORAL SYMPTOMS/MOOD NEUROLOGICAL BOWEL NUTRITION STATUS      Continent Diet  AMBULATORY STATUS COMMUNICATION OF NEEDS Skin   Extensive Assist Verbally Surgical wounds                       Personal Care Assistance Level of Assistance  Bathing, Dressing Bathing Assistance: Maximum assistance   Dressing Assistance: Maximum assistance     Functional Limitations Info             SPECIAL CARE FACTORS FREQUENCY  PT (By licensed PT), OT (By licensed OT)     PT Frequency: 5/wk OT Frequency: 5/wk            Contractures      Additional Factors Info  Code Status, Allergies Code Status Info: FULL Allergies Info: NKA           Current Medications (07/12/2015):  This is the current hospital active medication list Current Facility-Administered Medications  Medication Dose Route  Frequency Provider Last Rate Last Dose  . 0.9 %  sodium chloride infusion   Intravenous Continuous Nishant Dhungel, MD      . 0.9 %  sodium chloride infusion   Intravenous Continuous Mcarthur Rossetti, MD      . acetaminophen (TYLENOL) tablet 650 mg  650 mg Oral Q6H PRN Mcarthur Rossetti, MD       Or  . acetaminophen (TYLENOL) suppository 650 mg  650 mg Rectal Q6H PRN Mcarthur Rossetti, MD      . alum & mag hydroxide-simeth (MAALOX/MYLANTA) 200-200-20 MG/5ML suspension 30 mL  30 mL Oral Q4H PRN Mcarthur Rossetti, MD      . aspirin EC tablet 325 mg  325 mg Oral BID PC Mcarthur Rossetti, MD   325 mg at 07/12/15 1032  . atorvastatin (LIPITOR) tablet 40 mg  40 mg Oral Daily Nishant Dhungel, MD   40 mg at 07/12/15 1059  . busPIRone (BUSPAR) tablet 5 mg  5 mg Oral BID Nita Sells, MD      . docusate sodium (COLACE) capsule 100 mg  100 mg Oral BID Nishant Dhungel, MD   100 mg at 07/12/15 1059  . enoxaparin (LOVENOX) injection 40 mg  40 mg Subcutaneous Q24H Nishant Dhungel, MD      . HYDROcodone-acetaminophen (NORCO/VICODIN) 5-325 MG per tablet 1-2 tablet  1-2 tablet Oral Q6H  PRN Nishant Dhungel, MD      . HYDROmorphone (DILAUDID) injection 1 mg  1 mg Intravenous Q2H PRN Mcarthur Rossetti, MD      . lisinopril (PRINIVIL,ZESTRIL) tablet 20 mg  20 mg Oral Daily Nishant Dhungel, MD   20 mg at 07/12/15 1031  . menthol-cetylpyridinium (CEPACOL) lozenge 3 mg  1 lozenge Oral PRN Mcarthur Rossetti, MD       Or  . phenol (CHLORASEPTIC) mouth spray 1 spray  1 spray Mouth/Throat PRN Mcarthur Rossetti, MD      . methocarbamol (ROBAXIN) tablet 500 mg  500 mg Oral Q6H PRN Mcarthur Rossetti, MD       Or  . methocarbamol (ROBAXIN) 500 mg in dextrose 5 % 50 mL IVPB  500 mg Intravenous Q6H PRN Mcarthur Rossetti, MD      . metoCLOPramide (REGLAN) tablet 5-10 mg  5-10 mg Oral Q8H PRN Mcarthur Rossetti, MD       Or  . metoCLOPramide (REGLAN) injection 5-10 mg   5-10 mg Intravenous Q8H PRN Mcarthur Rossetti, MD      . morphine 2 MG/ML injection 0.5 mg  0.5 mg Intravenous Q2H PRN Nishant Dhungel, MD      . ondansetron (ZOFRAN) tablet 4 mg  4 mg Oral Q6H PRN Mcarthur Rossetti, MD       Or  . ondansetron [facility]) injection 4 mg  4 mg Intravenous Q6H PRN Mcarthur Rossetti, MD      . oxyCODONE (Oxy IR/ROXICODONE) immediate release tablet 5-10 mg  5-10 mg Oral Q4H PRN Mcarthur Rossetti, MD   5 mg at 07/12/15 0345  . zolpidem (AMBIEN) tablet 5 mg  5 mg Oral QHS PRN Mcarthur Rossetti, MD         Discharge Medications: Please see discharge summary for a list of discharge medications.  Relevant Imaging Results:  Relevant Lab Results:   Additional Information SS#: [id number]  [place], [place]

## 2015-07-13 LAB — COMPREHENSIVE METABOLIC PANEL
ALBUMIN: 2.6 g/dL — AB (ref 3.5–5.0)
ALK PHOS: 53 U/L (ref 38–126)
ALT: 18 U/L (ref 17–63)
ANION GAP: 6 (ref 5–15)
AST: 42 U/L — ABNORMAL HIGH (ref 15–41)
BUN: 20 mg/dL (ref 6–20)
CALCIUM: 8.3 mg/dL — AB (ref 8.9–10.3)
CHLORIDE: 107 mmol/L (ref 101–111)
CO2: 24 mmol/L (ref 22–32)
Creatinine, Ser: 1.04 mg/dL (ref 0.61–1.24)
GFR calc Af Amer: >60 mL/min (ref >60)
GFR calc non Af Amer: >60 mL/min (ref >60)
GLUCOSE: 138 mg/dL — AB (ref 65–99)
POTASSIUM: 4.1 mmol/L (ref 3.5–5.1)
SODIUM: 137 mmol/L (ref 135–145)
Total Bilirubin: 0.9 mg/dL (ref 0.3–1.2)
Total Protein: 4.7 g/dL — ABNORMAL LOW (ref 6.5–8.1)

## 2015-07-13 LAB — CBC WITH DIFFERENTIAL/PLATELET
BASOS PCT: 0 %
Basophils Absolute: 0 10*3/uL (ref 0.0–0.1)
EOS ABS: 0.2 10*3/uL (ref 0.0–0.7)
EOS PCT: 2 %
HCT: 29.3 % — ABNORMAL LOW (ref 39.0–52.0)
HEMOGLOBIN: 9.5 g/dL — AB (ref 13.0–17.0)
Lymphocytes Relative: 6 %
Lymphs Abs: 0.7 10*3/uL (ref 0.7–4.0)
MCH: 29.2 pg (ref 26.0–34.0)
MCHC: 32.4 g/dL (ref 30.0–36.0)
MCV: 90.2 fL (ref 78.0–100.0)
MONOS PCT: 6 %
Monocytes Absolute: 0.7 10*3/uL (ref 0.1–1.0)
NEUTROS PCT: 86 %
Neutro Abs: 9.7 10*3/uL — ABNORMAL HIGH (ref 1.7–7.7)
PLATELETS: 154 10*3/uL (ref 150–400)
RBC: 3.25 MIL/uL — AB (ref 4.22–5.81)
RDW: 14.2 % (ref 11.5–15.5)
WBC: 11.3 10*3/uL — AB (ref 4.0–10.5)

## 2015-07-13 LAB — GLUCOSE, CAPILLARY: Glucose-Capillary: 130 mg/dL — ABNORMAL HIGH (ref 65–99)

## 2015-07-13 MED ORDER — OXYCODONE HCL 5 MG PO TABS: 5.0000 mg | ORAL_TABLET | ORAL | Status: DC

## 2015-07-13 MED ORDER — SENNOSIDES-DOCUSATE SODIUM 8.6-50 MG PO TABS
1.0000 | ORAL_TABLET | Freq: Two times a day (BID) | ORAL | Status: DC
  Administered 2015-07-13 – 2015-07-14 (×3): 1 via ORAL
  Filled 2015-07-13 (×3): qty 1

## 2015-07-13 MED ORDER — POLYETHYLENE GLYCOL 3350 17 G PO PACK: 17.0000 g | PACK | Freq: Every day | ORAL | Status: DC

## 2015-07-13 MED ORDER — POLYETHYLENE GLYCOL 3350 17 G PO PACK
17.0000 g | PACK | Freq: Every day | ORAL | Status: DC
  Administered 2015-07-13 – 2015-07-14 (×2): 17 g via ORAL
  Filled 2015-07-13 (×2): qty 1

## 2015-07-13 MED ORDER — BUSPIRONE HCL 5 MG PO TABS: 5.0000 mg | ORAL_TABLET | Freq: Two times a day (BID) | ORAL | Status: DC

## 2015-07-13 MED ORDER — ZOLPIDEM TARTRATE 5 MG PO TABS: 5.0000 mg | ORAL_TABLET | Freq: Every evening | ORAL | Status: DC | PRN

## 2015-07-13 MED ORDER — ASPIRIN 325 MG PO TBEC: 325.0000 mg | DELAYED_RELEASE_TABLET | Freq: Two times a day (BID) | ORAL | Status: DC

## 2015-07-13 MED ORDER — SENNOSIDES-DOCUSATE SODIUM 8.6-50 MG PO TABS: 1.0000 | ORAL_TABLET | Freq: Two times a day (BID) | ORAL | Status: DC

## 2015-07-13 NOTE — Progress Notes (Signed)
Subjective: Patient stable pain controlled   Objective: Vital signs in last 24 hours: Temp:  [98.3 F (36.8 C)-99 F (37.2 C)] 98.3 F (36.8 C) (05/27 0459) Pulse Rate:  [63-76] 63 (05/27 0459) Resp:  [16-18] 18 (05/27 0459) BP: (100-105)/(60-75) 100/63 mmHg (05/27 0459) SpO2:  [90 %-100 %] 90 % (05/27 0459)  Intake/Output from previous day: 05/26 0701 - 05/27 0700 In: 480 [P.O.:480] Out: 350 [Urine:350] Intake/Output this shift:    Exam:  Sensation intact distally Intact pulses distally Dorsiflexion/Plantar flexion intact  Labs:  Recent Labs  07/11/15 1425 07/11/15 1847 07/12/15 0617 07/12/15 0818 07/13/15 0603  HGB 13.3 11.9* 10.7* 10.9* 9.5*    Recent Labs  07/12/15 0818 07/13/15 0603  WBC 12.1* 11.3*  RBC 3.75* 3.25*  HCT 33.4* 29.3*  PLT 215 154    Recent Labs  07/12/15 0617 07/13/15 0603  NA 139 137  K 4.0 4.1  CL 107 107  CO2 26 24  BUN 21* 20  CREATININE 1.17 1.04  GLUCOSE 137* 138*  CALCIUM 8.7* 8.3*    Recent Labs  07/11/15 1425  INR 1.00    Assessment/Plan: Patient will need skilled nursing facility placement weightbearing as tolerated right side   Jaimie Redditt SCOTT 07/13/2015, 7:58 AM

## 2015-07-13 NOTE — Progress Notes (Signed)
Pt ready for d/c on Sunday.  Facility aware.  Bernita Raisin, [place] Social Work [phone]

## 2015-07-13 NOTE — Discharge Summary (Addendum)
Physician Discharge Summary  Shawn Gomez [id number] DOB: 1933/03/31 DOA: 07/11/2015  PCP: Maricela Curet, MD  Admit date: 07/11/2015 Discharge date: 07/13/2015  Time spent: 35 minutes  Recommendations for Outpatient Follow-up:  1. Needs Aspirin 325 mg x 2 for 4 weeks 2. Need cbc and diff in about 1 week 3. MMSE, Geri-depression scale and GAD-7 to be done at [facility]  4. Consider Namenda as OP-see my note below 5. SNF MD-please ensure that patient does follow as OP with Dr. Ninfa Linden for post-op care in 1-2 weeks  Discharge Diagnoses:  Principal Problem:   Femoral neck fracture, right, closed, initial encounter Active Problems:   Hyperlipidemia   Essential hypertension   Discharge Condition: improved  Diet recommendation: heart healthy diet  Filed Weights   07/11/15 1335 07/11/15 1340  Weight: 79.379 kg (175 lb) 79.833 kg (176 lb)    History of present illness:  82 y/o ? HLD HTN Prior history of adenomatous colonic polyp 2011 Possible undiagnosed dementia (has had confusion for the past couple of months per family)  Admitted initially from [facility] ED and transferred to [facility] after accidental fall, right hip fracture   Hospital Course:  Principal Problem:   Femoral neck fracture, right, closed, initial encounter Active Problems:   Hyperlipidemia   Essential hypertension     1. per discretion Ortho services-appreciate their input into    Anticoagulation post-op--ASA 325 bid x 4 weeks  Weight bearing as tolerated  Wound care--Should follow Dr. Ninfa Linden 1-2 weeks  Pain management--Oxy Rx given  Dementia/OCD/Bipolar--THESE ARE ALL CHRONIC ISSUES of at least 3 months duration Estimate at least mild dementia -informal testing bedside would correlate to MMSe ~ 22-25/30 -family relates has had reminder post-it's at home to "keep him on track" -suggest as OP Geri-depression scale, GAD-7 and MMSE -added buspirone for anxiety component this  admission-hard script given on d/c -might try Cognitive behavioural therapy as OP? -Psychiatrist to detemrmine if Namenda might benefit [gaba-energic properties]  Htn Lisinopril 20 daily Controlled  ?  wbc Stress demargination No work-up necessary at this stage   Discharge Exam: Filed Vitals:   07/13/15 0012 07/13/15 0459  BP: 103/60 100/63  Pulse: 76 63  Temp: 98.8 F (37.1 C) 98.3 F (36.8 C)  Resp: 16 18    General exam: Appears.  Calm and oriented and in no distress less confused, slight anxiety Respiratory system: Clear to auscultation. Respiratory effort normal. Cardiovascular system: S1 & S2 heard, RRR. No JVD Gastrointestinal system: Abdomen is nondistended, soft and nontender. No organomegaly or masses felt. Normal bowel sounds heard. Central nervous system: No focal neurological deficits. Extremities: Symmetric 5 x 5 power, except RLE Wound not visualised   Full code Discussed with sister on phone [place]   Discharge Instructions   Discharge Instructions    Diet - low sodium heart healthy    Complete by:  As directed      Increase activity slowly    Complete by:  As directed           Current Discharge Medication List    START taking these medications   Details  busPIRone (BUSPAR) 5 MG tablet Take 1 tablet (5 mg total) by mouth 2 (two) times daily. Qty: 60 tablet, Refills: 0    oxyCODONE (OXY IR/ROXICODONE) 5 MG immediate release tablet Take 1 tablet (5 mg total) by mouth every 4 (four) hours. Qty: 30 tablet, Refills: 0    polyethylene glycol (MIRALAX / GLYCOLAX) packet Take 17  g by mouth daily. Qty: 14 each, Refills: 0    senna-docusate (SENOKOT-S) 8.6-50 MG tablet Take 1 tablet by mouth 2 (two) times daily. Qty: 30 tablet, Refills: 0    zolpidem (AMBIEN) 5 MG tablet Take 1 tablet (5 mg total) by mouth at bedtime as needed for sleep. Qty: 30 tablet, Refills: 0      CONTINUE these medications which have CHANGED   Details   aspirin EC 325 MG EC tablet Take 1 tablet (325 mg total) by mouth 2 (two) times daily after a meal. Qty: 30 tablet, Refills: 0      CONTINUE these medications which have NOT CHANGED   Details  atorvastatin (LIPITOR) 40 MG tablet Take 40 mg by mouth daily.    lisinopril (PRINIVIL,ZESTRIL) 20 MG tablet Take 20 mg by mouth daily.       No Known Allergies    The results of significant diagnostics from this hospitalization (including imaging, microbiology, ancillary and laboratory) are listed below for reference.    Significant Diagnostic Studies: Dg Chest 1 View  07/11/2015  CLINICAL DATA:  Right hip and low back pain after slipping and falling on a wet surface outside today. EXAM: CHEST 1 VIEW COMPARISON:  None. FINDINGS: The heart size and mediastinal contours are within normal limits. Both lungs are clear. The visualized skeletal structures are unremarkable. IMPRESSION: Normal examination. Electronically Signed   By: Claudie Revering M.D.   On: 07/11/2015 15:14   Dg Lumbar Spine Complete  07/11/2015  CLINICAL DATA:  Low back and right hip pain after slipping and falling on a wet surface today. EXAM: LUMBAR SPINE - COMPLETE 4+ VIEW COMPARISON:  None. FINDINGS: Five non-rib-bearing lumbar vertebrae. Mild to moderate lateral spur formation at multiple levels. Mild to moderate anterior spur formation at the L3-4 level. No fractures, subluxations or pars defects are seen. IMPRESSION: 1. No fracture or subluxation. 2. Degenerative changes. Electronically Signed   By: Claudie Revering M.D.   On: 07/11/2015 15:21   Pelvis Portable  07/11/2015  CLINICAL DATA:  Status post right hip arthroplasty. Initial encounter. EXAM: PORTABLE PELVIS 1-2 VIEWS COMPARISON:  None. FINDINGS: The patient is status post right hip arthroplasty. There is no evidence of fracture or loosening. Scattered postoperative soft tissue air is seen on the right. The left hip joint is grossly unremarkable. IMPRESSION: Status post right  hip arthroplasty. No evidence of fracture or loosening. Electronically Signed   By: Garald Balding M.D.   On: 07/11/2015 23:04   Dg Hip Operative Unilat With Pelvis Right  07/11/2015  CLINICAL DATA:  Right hip replacement. EXAM: OPERATIVE RIGHT HIP (WITH PELVIS IF PERFORMED) 4 VIEWS TECHNIQUE: Fluoroscopic spot image(s) were submitted for interpretation post-operatively. COMPARISON:  None. FINDINGS: Four fluoroscopic spot images are provided from intraoperative right hip arthroplasty. Hardware appears appropriately positioned. No evidence of surgical complicating feature. IMPRESSION: Fluoroscopic spot images status post right hip arthroplasty. Hardware appears appropriately positioned. Fluoroscopy provided for 38 seconds. Electronically Signed   By: Franki Cabot M.D.   On: 07/11/2015 21:39   Dg Hip Unilat With Pelvis 2-3 Views Right  07/11/2015  CLINICAL DATA:  Right hip pain status post fall. EXAM: DG HIP (WITH OR WITHOUT PELVIS) 2-3V RIGHT COMPARISON:  None. FINDINGS: There is a mildly comminuted impacted capitellar fracture of the right femur with mild impaction. The femoral head is normally located into the acetabulum. Mild osteoarthritic changes of bilateral hips are seen. There is no evidence of pelvic fractures. IMPRESSION: Mildly comminuted impacted sub  capitellar fracture of the right femoral neck. No evidence of subluxation. Electronically Signed   By: Fidela Salisbury M.D.   On: 07/11/2015 15:15    Microbiology: Recent Results (from the past 240 hour(s))  Surgical pcr screen     Status: None   Collection Time: 07/11/15  7:27 PM  Result Value Ref Range Status   MRSA, PCR NEGATIVE NEGATIVE Final   Staphylococcus aureus NEGATIVE NEGATIVE Final    Comment:        The Xpert SA Assay (FDA approved for NASAL specimens in patients over 22 years of age), is one component of a comprehensive surveillance program.  Test performance has been validated by [facility] for patients  greater than or equal to 63 year old. It is not intended to diagnose infection nor to guide or monitor treatment.      Labs: Basic Metabolic Panel:  Recent Labs Lab 07/11/15 1425 07/11/15 1847 07/12/15 0617 07/13/15 0603  NA 137  --  139 137  K 4.1  --  4.0 4.1  CL 107  --  107 107  CO2 21*  --  26 24  GLUCOSE 127*  --  137* 138*  BUN 33*  --  21* 20  CREATININE 1.26* 1.16 1.17 1.04  CALCIUM 9.5  --  8.7* 8.3*   Liver Function Tests:  Recent Labs Lab 07/13/15 0603  AST 42*  ALT 18  ALKPHOS 53  BILITOT 0.9  PROT 4.7*  ALBUMIN 2.6*   No results for input(s): LIPASE, AMYLASE in the last 168 hours. No results for input(s): AMMONIA in the last 168 hours. CBC:  Recent Labs Lab 07/11/15 1425 07/11/15 1847 07/12/15 0617 07/12/15 0818 07/13/15 0603  WBC 25.5* 18.2* 12.6* 12.1* 11.3*  NEUTROABS 23.7*  --   --  10.6* 9.7*  HGB 13.3 11.9* 10.7* 10.9* 9.5*  HCT 41.1 36.7* 33.3* 33.4* 29.3*  MCV 89.3 88.4 89.5 89.1 90.2  PLT 291 263 207 215 154   Cardiac Enzymes: No results for input(s): CKTOTAL, CKMB, CKMBINDEX, TROPONINI in the last 168 hours. BNP: BNP (last 3 results) No results for input(s): BNP in the last 8760 hours.  ProBNP (last 3 results) No results for input(s): PROBNP in the last 8760 hours.  CBG:  Recent Labs Lab 07/11/15 1721 07/13/15 0626  GLUCAP 83 130*       Signed:  Nita Sells MD   [facility] 07/13/2015, 10:17 AM

## 2015-07-14 ENCOUNTER — Inpatient Hospital Stay
Admission: RE | Admit: 2015-07-14 | Discharge: 2015-08-21 | Disposition: A | Discharge status: Home / Self Care | Payer: Medicare Other | Source: Ambulatory Visit | Attending: Internal Medicine | Admitting: Internal Medicine

## 2015-07-14 NOTE — Progress Notes (Signed)
No changes Patient ready for discharge today 07/14/2015 to skilled nursing facility

## 2015-07-14 NOTE — Clinical Social Work Placement (Signed)
   CLINICAL SOCIAL WORK PLACEMENT  NOTE  Date:  07/14/2015  Patient Details  Name: Shawn Gomez MRN: [id number] Date of Birth: 1933/07/10  Clinical Social Work is seeking post-discharge placement for this patient at the [place] level of care (*CSW will initial, date and re-position this form in  chart as items are completed):  Yes   Patient/family provided with [place] Work Department's list of facilities offering this level of care within the geographic area requested by the patient (or if unable, by the patient's family).  Yes   Patient/family informed of their freedom to choose among providers that offer the needed level of care, that participate in Medicare, Medicaid or managed care program needed by the patient, have an available bed and are willing to accept the patient.  Yes   Patient/family informed of [facility]'s ownership interest in  [facility] and [facility], as well as of the fact that they are under no obligation to receive care at these facilities.  PASRR submitted to EDS on 07/12/15     PASRR number received on 07/12/15     Existing PASRR number confirmed on       FL2 transmitted to all facilities in geographic area requested by pt/family on 07/12/15     FL2 transmitted to all facilities within larger geographic area on       Patient informed that his/her managed care company has contracts with or will negotiate with certain facilities, including the following:        Yes   Patient/family informed of bed offers received.  Patient chooses bed at [facility]     Physician recommends and patient chooses bed at      Patient to be transferred to [facility] on 07/14/15.  Patient to be transferred to facility by  Corey Harold)     Patient family notified on 07/14/15 of transfer.  Name of family member notified:   (Pt's niece, Angela Nevin )     PHYSICIAN Please sign FL2, Please prepare priority discharge  summary, including medications     Additional Comment:    _______________________________________________ Rozell Searing, LCSW 07/14/2015, 10:48 AM

## 2015-07-14 NOTE — Progress Notes (Signed)
Report gave to RN Florence Canner at [facility] center and pt discharge to [facility] center via ambulance.

## 2015-07-14 NOTE — Clinical Social Work Note (Signed)
Clinical Social Worker facilitated patient discharge including contacting patient family and facility to confirm patient discharge plans.  Clinical information faxed to facility and family agreeable with plan.  CSW arranged ambulance transport via [place] to [facility].  RN to call report prior to discharge.  Clinical Social Worker will sign off for now as social work intervention is no longer needed. Please consult [place] again if new need arises.  Glendon Axe, MSW, LCSWA [phone] 07/14/2015 10:48 AM

## 2015-07-14 NOTE — Discharge Instructions (Signed)

## 2015-07-14 NOTE — Discharge Summary (Signed)
Physician Discharge Summary Addend-patient seenexamined 5.28.17 and stable for d/c to SNF  Shawn Gomez [id number] DOB: 05-06-1933 DOA: 07/11/2015  PCP: Maricela Curet, MD  Admit date: 07/11/2015 Discharge date: 07/14/2015  Time spent: 35 minutes  Recommendations for Outpatient Follow-up:  1. Needs Aspirin 325 mg x 2 for 4 weeks 2. Need cbc and diff in about 1 week 3. MMSE, Geri-depression scale and GAD-7 to be done at [facility]  4. Consider Namenda as OP-see my note below 5. SNF MD-please ensure that patient does follow as OP with Dr. Ninfa Linden for post-op care in 1-2 weeks  Discharge Diagnoses:  Principal Problem:   Femoral neck fracture, right, closed, initial encounter Active Problems:   Hyperlipidemia   Essential hypertension   Discharge Condition: improved  Diet recommendation: heart healthy diet  Filed Weights   07/11/15 1335 07/11/15 1340  Weight: 79.379 kg (175 lb) 79.833 kg (176 lb)    History of present illness:  82 y/o ? HLD HTN Prior history of adenomatous colonic polyp 2011 Possible undiagnosed dementia (has had confusion for the past couple of months per family)  Admitted initially from [facility] ED and transferred to [facility] after accidental fall, right hip fracture   Hospital Course:  Principal Problem:   Femoral neck fracture, right, closed, initial encounter Active Problems:   Hyperlipidemia   Essential hypertension     1. per discretion Ortho services-appreciate their input into    Anticoagulation post-op--ASA 325 bid x 4 weeks  Weight bearing as tolerated  Wound care--Should follow Dr. Ninfa Linden 1-2 weeks  Pain management--Oxy Rx given  Dementia/OCD/Bipolar--THESE ARE ALL CHRONIC ISSUES of at least 3 months duration Estimate at least mild dementia -informal testing bedside would correlate to MMSe ~ 22-25/30 -family relates has had reminder post-it's at home to "keep him on track" -suggest as OP Geri-depression scale, GAD-7  and MMSE -added buspirone for anxiety component this admission-hard script given on d/c -might try Cognitive behavioural therapy as OP? -Psychiatrist to detemrmine if Namenda might benefit [gaba-energic properties]  Htn Lisinopril 20 daily Controlled  ?  wbc Stress demargination No work-up necessary at this stage   Discharge Exam: Filed Vitals:   07/13/15 2126 07/14/15 0708  BP: 96/53 90/45  Pulse: 84 82  Temp: 97.8 F (36.6 C) 97.9 F (36.6 C)  Resp: 18 18    General exam: Appears.  less confused, slight anxiety Respiratory system: Clear to auscultation. Respiratory effort normal. Cardiovascular system: S1 & S2 heard, RRR. No JVD Gastrointestinal system: Abdomen is nondistended, soft and nontender. No organomegaly or masses felt. Normal bowel sounds heard. Central nervous system: No focal neurological deficits. Extremities: Symmetric 5 x 5 power, except RLE Wound not visualised   Full code Discussed with sister on phone [place]   Discharge Instructions   Discharge Instructions    Diet - low sodium heart healthy    Complete by:  As directed      Full weight bearing    Complete by:  As directed      Increase activity slowly    Complete by:  As directed           Current Discharge Medication List    START taking these medications   Details  busPIRone (BUSPAR) 5 MG tablet Take 1 tablet (5 mg total) by mouth 2 (two) times daily. Qty: 60 tablet, Refills: 0    oxyCODONE (OXY IR/ROXICODONE) 5 MG immediate release tablet Take 1 tablet (5 mg total) by mouth every 4 (  four) hours. Qty: 30 tablet, Refills: 0    polyethylene glycol (MIRALAX / GLYCOLAX) packet Take 17 g by mouth daily. Qty: 14 each, Refills: 0    senna-docusate (SENOKOT-S) 8.6-50 MG tablet Take 1 tablet by mouth 2 (two) times daily. Qty: 30 tablet, Refills: 0    zolpidem (AMBIEN) 5 MG tablet Take 1 tablet (5 mg total) by mouth at bedtime as needed for sleep. Qty: 30 tablet,  Refills: 0      CONTINUE these medications which have CHANGED   Details  aspirin EC 325 MG EC tablet Take 1 tablet (325 mg total) by mouth 2 (two) times daily after a meal. Qty: 30 tablet, Refills: 0      CONTINUE these medications which have NOT CHANGED   Details  atorvastatin (LIPITOR) 40 MG tablet Take 40 mg by mouth daily.    lisinopril (PRINIVIL,ZESTRIL) 20 MG tablet Take 20 mg by mouth daily.       No Known Allergies Follow-up Information    Follow up with Mcarthur Rossetti, MD. Schedule an appointment as soon as possible for a visit in 2 weeks.   Specialty:  Orthopedic Surgery   Contact information:   [address] [place] [address] [phone]        The results of significant diagnostics from this hospitalization (including imaging, microbiology, ancillary and laboratory) are listed below for reference.    Significant Diagnostic Studies: Dg Chest 1 View  07/11/2015  CLINICAL DATA:  Right hip and low back pain after slipping and falling on a wet surface outside today. EXAM: CHEST 1 VIEW COMPARISON:  None. FINDINGS: The heart size and mediastinal contours are within normal limits. Both lungs are clear. The visualized skeletal structures are unremarkable. IMPRESSION: Normal examination. Electronically Signed   By: Claudie Revering M.D.   On: 07/11/2015 15:14   Dg Lumbar Spine Complete  07/11/2015  CLINICAL DATA:  Low back and right hip pain after slipping and falling on a wet surface today. EXAM: LUMBAR SPINE - COMPLETE 4+ VIEW COMPARISON:  None. FINDINGS: Five non-rib-bearing lumbar vertebrae. Mild to moderate lateral spur formation at multiple levels. Mild to moderate anterior spur formation at the L3-4 level. No fractures, subluxations or pars defects are seen. IMPRESSION: 1. No fracture or subluxation. 2. Degenerative changes. Electronically Signed   By: Claudie Revering M.D.   On: 07/11/2015 15:21   Pelvis Portable  07/11/2015  CLINICAL DATA:  Status post  right hip arthroplasty. Initial encounter. EXAM: PORTABLE PELVIS 1-2 VIEWS COMPARISON:  None. FINDINGS: The patient is status post right hip arthroplasty. There is no evidence of fracture or loosening. Scattered postoperative soft tissue air is seen on the right. The left hip joint is grossly unremarkable. IMPRESSION: Status post right hip arthroplasty. No evidence of fracture or loosening. Electronically Signed   By: Garald Balding M.D.   On: 07/11/2015 23:04   Dg Hip Operative Unilat With Pelvis Right  07/11/2015  CLINICAL DATA:  Right hip replacement. EXAM: OPERATIVE RIGHT HIP (WITH PELVIS IF PERFORMED) 4 VIEWS TECHNIQUE: Fluoroscopic spot image(s) were submitted for interpretation post-operatively. COMPARISON:  None. FINDINGS: Four fluoroscopic spot images are provided from intraoperative right hip arthroplasty. Hardware appears appropriately positioned. No evidence of surgical complicating feature. IMPRESSION: Fluoroscopic spot images status post right hip arthroplasty. Hardware appears appropriately positioned. Fluoroscopy provided for 38 seconds. Electronically Signed   By: Franki Cabot M.D.   On: 07/11/2015 21:39   Dg Hip Unilat With Pelvis 2-3 Views Right  07/11/2015  CLINICAL DATA:  Right hip pain status post fall. EXAM: DG HIP (WITH OR WITHOUT PELVIS) 2-3V RIGHT COMPARISON:  None. FINDINGS: There is a mildly comminuted impacted capitellar fracture of the right femur with mild impaction. The femoral head is normally located into the acetabulum. Mild osteoarthritic changes of bilateral hips are seen. There is no evidence of pelvic fractures. IMPRESSION: Mildly comminuted impacted sub capitellar fracture of the right femoral neck. No evidence of subluxation. Electronically Signed   By: Fidela Salisbury M.D.   On: 07/11/2015 15:15    Microbiology: Recent Results (from the past 240 hour(s))  Surgical pcr screen     Status: None   Collection Time: 07/11/15  7:27 PM  Result Value Ref Range  Status   MRSA, PCR NEGATIVE NEGATIVE Final   Staphylococcus aureus NEGATIVE NEGATIVE Final    Comment:        The Xpert SA Assay (FDA approved for NASAL specimens in patients over 43 years of age), is one component of a comprehensive surveillance program.  Test performance has been validated by [facility] for patients greater than or equal to 53 year old. It is not intended to diagnose infection nor to guide or monitor treatment.      Labs: Basic Metabolic Panel:  Recent Labs Lab 07/11/15 1425 07/11/15 1847 07/12/15 0617 07/13/15 0603  NA 137  --  139 137  K 4.1  --  4.0 4.1  CL 107  --  107 107  CO2 21*  --  26 24  GLUCOSE 127*  --  137* 138*  BUN 33*  --  21* 20  CREATININE 1.26* 1.16 1.17 1.04  CALCIUM 9.5  --  8.7* 8.3*   Liver Function Tests:  Recent Labs Lab 07/13/15 0603  AST 42*  ALT 18  ALKPHOS 53  BILITOT 0.9  PROT 4.7*  ALBUMIN 2.6*   No results for input(s): LIPASE, AMYLASE in the last 168 hours. No results for input(s): AMMONIA in the last 168 hours. CBC:  Recent Labs Lab 07/11/15 1425 07/11/15 1847 07/12/15 0617 07/12/15 0818 07/13/15 0603  WBC 25.5* 18.2* 12.6* 12.1* 11.3*  NEUTROABS 23.7*  --   --  10.6* 9.7*  HGB 13.3 11.9* 10.7* 10.9* 9.5*  HCT 41.1 36.7* 33.3* 33.4* 29.3*  MCV 89.3 88.4 89.5 89.1 90.2  PLT 291 263 207 215 154   Cardiac Enzymes: No results for input(s): CKTOTAL, CKMB, CKMBINDEX, TROPONINI in the last 168 hours. BNP: BNP (last 3 results) No results for input(s): BNP in the last 8760 hours.  ProBNP (last 3 results) No results for input(s): PROBNP in the last 8760 hours.  CBG:  Recent Labs Lab 07/11/15 1721 07/13/15 0626  GLUCAP 83 130*       Signed:  Nita Sells MD   [facility] 07/14/2015, 10:38 AM

## 2015-07-15 ENCOUNTER — Encounter (HOSPITAL_COMMUNITY): Payer: Self-pay | Admitting: Orthopaedic Surgery

## 2015-07-16 ENCOUNTER — Non-Acute Institutional Stay (SKILLED_NURSING_FACILITY): Payer: Medicare Other | Admitting: Internal Medicine

## 2015-07-16 ENCOUNTER — Encounter: Payer: Self-pay | Admitting: Internal Medicine

## 2015-07-16 ENCOUNTER — Other Ambulatory Visit: Payer: Self-pay

## 2015-07-16 DIAGNOSIS — D649 Anemia, unspecified: Secondary | ICD-10-CM | POA: Diagnosis not present

## 2015-07-16 DIAGNOSIS — D72829 Elevated white blood cell count, unspecified: Secondary | ICD-10-CM

## 2015-07-16 DIAGNOSIS — S72001A Fracture of unspecified part of neck of right femur, initial encounter for closed fracture: Secondary | ICD-10-CM

## 2015-07-16 DIAGNOSIS — F411 Generalized anxiety disorder: Secondary | ICD-10-CM | POA: Diagnosis not present

## 2015-07-16 MED ORDER — OXYCODONE HCL 5 MG PO TABS: 5.0000 mg | ORAL_TABLET | ORAL | Status: DC

## 2015-07-16 NOTE — Patient Instructions (Signed)
New orders for Matrix entry. CBC and differential  07/19/2015

## 2015-07-16 NOTE — Progress Notes (Signed)
Patient ID: Shawn Gomez, male   DOB: 07/22/1933, 82 y.o.   MRN: [id number]    This is a comprehensive admission note to [facility] personally performed by Unice Cobble MD on this date less than 30 days from date of admission. Included are preadmission medical/surgical history;reconciled medication list; family history; social history and comprehensive review of systems.  Corrections and additions to the records were documented . Comprehensive physical exam was also performed. Additionally a clinical summary was entered for each active diagnosis pertinent to this admission in the Problem List to enhance continuity of care.  PCP: Lucia Gaskins MD  HPI: The patient is 82 year old admitted to [facility] 07/13/15 after being hospitalized 5/25-5/27 following sustaining a right femoral neck fracture in a fall. He is to follow-up with Dr. Ninfa Linden, orthopedist in 1-2 weeks. He was discharged on aspirin 325 mg twice a day for 4 weeks. Weightbearing was to be as tolerated. Discharge summary states the patient has obsessive-compulsive disorder &  bipolar disorder. He  is felt to have at least mild dementia as MMSE testing revealed a score of 22-25 out of 30. He was found to have a component of depression as well as anxiety. Buspirone was added; initiation of Namenda was deferred to any psychiatric evaluation. Medical diagnoses include dyslipidemia and essential hypertension. Blood pressure was thought to be adequately controlled on lisinopril 20 mg daily. He did exhibit leukocytosis which was attributed to stress margination. Initial white count was 25,500; at the time of discharge it was 11,300. He did have mild hyperglycemia with glucoses ranging from 127-138. He also had mild renal insufficiency with an initial creatinine of 1.26. There was minimal elevation of AST at 42. Postoperatively he did exhibit anemia with hemoglobin of 9.5 and hematocrit 29.3. He was discharged on ferrous sulfate 325  mg daily   Past medical and surgical history: He previously had left anterior cruciate ligament repair. His also had hernia repair. He's had bilateral cataract surgery.  Social history: Never smoker; occasional Alcohol Intake. 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  Family history: Seizures in his father and stroke in his mother  Comprehensive review of systems: He states that he fell as he was trying to take laundry from the trunk of his car into his home Denied were any change in heart rhythm or rate prior to the event. There was no associated chest pain or shortness of breath . Also specifically denied prior to the episode were headache, limb weakness, tingling, or numbness. No seizure activity noted. Apparently the family uses Post-it notes at home to "keep him on track". He admits to some anxiety and states that he worries significantly. He also admits to some depression.  Constitutional: No fever,significant weight change, fatigue  Eyes: No redness, discharge, pain, vision change ENT/mouth: No nasal congestion,  purulent discharge, earache,change in hearing ,sore throat  Cardiovascular: No chest pain, palpitations,paroxysmal nocturnal dyspnea, claudication, edema  Respiratory: No cough, sputum production,hemoptysis, DOE , significant snoring,apnea   Gastrointestinal: No heartburn,dysphagia,abdominal pain, nausea / vomiting,rectal bleeding, melena,change in bowels Genitourinary: No dysuria,hematuria, pyuria,  incontinence, nocturia Musculoskeletal: No joint stiffness, joint swelling, weakness,pain Dermatologic: No rash, pruritus, change in appearance of skin Neurologic: No dizziness,headache,syncope, seizures, numbness , tingling Endocrine: No change in hair/skin/ nails, excessive thirst, excessive hunger, excessive urination  Hematologic/lymphatic: No significant  bruising, lymphadenopathy,abnormal bleeding Allergy/immunology: No itchy/ watery eyes, significant sneezing,  urticaria, angioedema  Physical exam:  Pertinent or positive findings: Pattern alopecia is present. He has a Engineering geologist. There is asymmetric ptosis, greater on the left. 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 He has minor DIP arthritic changes. Despite the question of dementia he is oriented 3 General appearance:Adequately nourished; no acute distress , increased work of breathing is present.   Lymphatic: No lymphadenopathy about the head, neck, axilla . Eyes: No conjunctival inflammation or lid edema is present. There is no scleral icterus. Ears:  External ear exam shows no significant lesions or deformities.   Nose:  External nasal examination shows no deformity or inflammation. Nasal mucosa are pink and moist without lesions ,exudates Oral exam: lips and gums are healthy appearing.There is no oropharyngeal erythema or exudate . Neck:  No thyromegaly, masses, tenderness noted.    Heart:  Normal rate and regular rhythm. S1 and S2 normal without gallop, murmur, click, rub .  Lungs:Chest clear to auscultation without wheezes, rhonchi,rales , rubs. Abdomen:Bowel sounds are normal. Abdomen is soft and nontender with no organomegaly, hernias,masses. GU: deferred . Extremities:  No cyanosis, clubbing,edema  Neurologic exam : Cn 2-7 intact Strength equal  in upper  extremities Balance,Rhomberg,finger to nose testing could not be completed due to clinical state Deep tendon reflexes are equal Skin: Warm & dry w/o tenting. No significant lesions or rash.  See clinical summary under each active problem in the Problem List with associated updated therapeutic plan

## 2015-07-16 NOTE — Assessment & Plan Note (Signed)
CBC & dif 6/2

## 2015-07-16 NOTE — Assessment & Plan Note (Signed)
On iron  CBC  6/2

## 2015-07-16 NOTE — Assessment & Plan Note (Signed)
PT @ [facility]

## 2015-07-16 NOTE — Telephone Encounter (Signed)
Rx faxed to [facility] at [phone].   [address] [place], [place] [address]  Phone #: [phone] 

## 2015-07-17 ENCOUNTER — Other Ambulatory Visit: Payer: Self-pay | Admitting: *Deleted

## 2015-07-17 MED ORDER — ZOLPIDEM TARTRATE 5 MG PO TABS: 5.0000 mg | ORAL_TABLET | Freq: Every evening | ORAL | Status: DC | PRN

## 2015-07-17 NOTE — Telephone Encounter (Signed)
[facility]  

## 2015-07-19 ENCOUNTER — Other Ambulatory Visit (HOSPITAL_COMMUNITY)
Admission: AD | Admit: 2015-07-19 | Discharge: 2015-07-19 | Disposition: A | Discharge status: Home / Self Care | Payer: Medicare Other | Source: Skilled Nursing Facility | Attending: Internal Medicine | Admitting: Internal Medicine

## 2015-07-19 LAB — CBC WITH DIFFERENTIAL/PLATELET
BASOS ABS: 0.1 10*3/uL (ref 0.0–0.1)
BASOS PCT: 1 %
EOS PCT: 3 %
Eosinophils Absolute: 0.3 10*3/uL (ref 0.0–0.7)
HCT: 30.2 % — ABNORMAL LOW (ref 39.0–52.0)
Hemoglobin: 10 g/dL — ABNORMAL LOW (ref 13.0–17.0)
Lymphocytes Relative: 13 %
Lymphs Abs: 1.3 10*3/uL (ref 0.7–4.0)
MCH: 29.9 pg (ref 26.0–34.0)
MCHC: 33.1 g/dL (ref 30.0–36.0)
MCV: 90.1 fL (ref 78.0–100.0)
MONO ABS: 0.8 10*3/uL (ref 0.1–1.0)
Monocytes Relative: 8 %
Neutro Abs: 7.5 10*3/uL (ref 1.7–7.7)
Neutrophils Relative %: 75 %
PLATELETS: 327 10*3/uL (ref 150–400)
RBC: 3.35 MIL/uL — ABNORMAL LOW (ref 4.22–5.81)
RDW: 13.7 % (ref 11.5–15.5)
WBC: 9.9 10*3/uL (ref 4.0–10.5)

## 2015-07-26 ENCOUNTER — Encounter (HOSPITAL_COMMUNITY)
Admission: RE | Admit: 2015-07-26 | Discharge: 2015-07-26 | Disposition: A | Discharge status: Home / Self Care | Payer: Medicare Other | Source: Skilled Nursing Facility | Attending: Internal Medicine | Admitting: Internal Medicine

## 2015-07-26 DIAGNOSIS — I10 Essential (primary) hypertension: Secondary | ICD-10-CM | POA: Insufficient documentation

## 2015-07-26 LAB — CBC WITH DIFFERENTIAL/PLATELET
BASOS ABS: 0 10*3/uL (ref 0.0–0.1)
BASOS PCT: 0 %
Eosinophils Absolute: 0.1 10*3/uL (ref 0.0–0.7)
Eosinophils Relative: 1 %
HEMATOCRIT: 32.8 % — AB (ref 39.0–52.0)
HEMOGLOBIN: 11 g/dL — AB (ref 13.0–17.0)
LYMPHS PCT: 11 %
Lymphs Abs: 1.1 10*3/uL (ref 0.7–4.0)
MCH: 30.2 pg (ref 26.0–34.0)
MCHC: 33.5 g/dL (ref 30.0–36.0)
MCV: 90.1 fL (ref 78.0–100.0)
MONOS PCT: 9 %
Monocytes Absolute: 0.9 10*3/uL (ref 0.1–1.0)
NEUTROS ABS: 7.8 10*3/uL — AB (ref 1.7–7.7)
Neutrophils Relative %: 79 %
Platelets: 449 10*3/uL — ABNORMAL HIGH (ref 150–400)
RBC: 3.64 MIL/uL — ABNORMAL LOW (ref 4.22–5.81)
RDW: 14.4 % (ref 11.5–15.5)
WBC: 9.9 10*3/uL (ref 4.0–10.5)

## 2015-07-26 LAB — BASIC METABOLIC PANEL
ANION GAP: 6 (ref 5–15)
BUN: 21 mg/dL — AB (ref 6–20)
CHLORIDE: 101 mmol/L (ref 101–111)
CO2: 27 mmol/L (ref 22–32)
Calcium: 9.1 mg/dL (ref 8.9–10.3)
Creatinine, Ser: 0.81 mg/dL (ref 0.61–1.24)
GFR calc Af Amer: >60 mL/min (ref >60)
GFR calc non Af Amer: >60 mL/min (ref >60)
GLUCOSE: 111 mg/dL — AB (ref 65–99)
POTASSIUM: 4.1 mmol/L (ref 3.5–5.1)
Sodium: 134 mmol/L — ABNORMAL LOW (ref 135–145)

## 2015-08-16 ENCOUNTER — Encounter: Payer: Self-pay | Admitting: Internal Medicine

## 2015-08-16 ENCOUNTER — Non-Acute Institutional Stay (SKILLED_NURSING_FACILITY): Payer: Medicare Other | Admitting: Internal Medicine

## 2015-08-16 DIAGNOSIS — S72001A Fracture of unspecified part of neck of right femur, initial encounter for closed fracture: Secondary | ICD-10-CM | POA: Diagnosis not present

## 2015-08-16 DIAGNOSIS — I10 Essential (primary) hypertension: Secondary | ICD-10-CM | POA: Diagnosis not present

## 2015-08-16 DIAGNOSIS — D649 Anemia, unspecified: Secondary | ICD-10-CM

## 2015-08-16 NOTE — Progress Notes (Signed)
Location:   [facility] Room Number: 102/P Place of Service:  SNF (31) Provider:  Daphane Shepherd, MD  Patient Care Team: Lucia Gaskins, MD as PCP - General (Internal Medicine)  Extended Emergency Contact Information Primary Emergency Contact: Annabelle Harman Address: [address]          [place], [place] [address] Johnnette Litter of [place] Phone: [phone] Mobile Phone: [phone] Relation: Sister Secondary Emergency Contact: Aundria Mems [place] of [place] Phone: [phone] Relation: Niece  Code Status:  DNR Goals of care: Advanced Directive information Advanced Directives 08/16/2015  Does patient have an advance directive? Yes  Type of Paramedic of [place];Out of facility DNR (pink MOST or yellow form)  Does patient want to make changes to advanced directive? No - Patient declined  Copy of advanced directive(s) in chart? Yes     Chief Complaint  Patient presents with  . Discharge Note    HPI:  Pt is a 82 y.o. male seen today for discharge --He initially sustained a right femoral neck fracture apparently after a fall and this was surgically repaired-postop course was fairly uncomplicated he did develop a seroma which was drained there's been no reoccurrence this appears to have healed unremarkably.  He has progressed well with therapy and now using a walker.  He will be going home with his niece will need continued PT and OT-also rolling walker to assist with ambulation.  His other medical issues including hypertension appear to be stable recent blood pressures stable at 112/70 and 119/77  Today hehas no acute complaints is looking forward to going home        Past Medical History  Diagnosis Date  . Hyperlipidemia   . HTN (hypertension)    Past Surgical History  Procedure Laterality Date  . Anterior cruciate ligament repair Left   . Hernia repair Left     Inguinal  . Cataract  extraction w/phaco Right 06/14/2014    Procedure: CATARACT EXTRACTION PHACO AND INTRAOCULAR LENS PLACEMENT RIGHT EYE;  Surgeon: Tonny Branch, MD;  Location: [facility];  Service: Ophthalmology;  Laterality: Right;  CDE 5.47  . Cataract extraction w/phaco Left 07/12/2014    Procedure: CATARACT EXTRACTION PHACO AND INTRAOCULAR LENS PLACEMENT: CDE:  5.63;  Surgeon: Tonny Branch, MD;  Location: [facility];  Service: Ophthalmology;  Laterality: Left;  . Total hip arthroplasty Right 07/11/2015    Procedure: TOTAL HIP ARTHROPLASTY ANTERIOR APPROACH;  Surgeon: Mcarthur Rossetti, MD;  Location: [facility];  Service: Orthopedics;  Laterality: Right;  . Adenomatous polypectomy  2011    No Known Allergies  Current Outpatient Prescriptions on File Prior to Visit  Medication Sig Dispense Refill  . aspirin EC 325 MG EC tablet Take 1 tablet (325 mg total) by mouth 2 (two) times daily after a meal. 30 tablet 0  . atorvastatin (LIPITOR) 40 MG tablet Take 40 mg by mouth daily.    . busPIRone (BUSPAR) 5 MG tablet Take 1 tablet (5 mg total) by mouth 2 (two) times daily. 60 tablet 0  . ferrous sulfate 325 (65 FE) MG EC tablet Take 325 mg by mouth daily with breakfast.    . oxyCODONE (OXY IR/ROXICODONE) 5 MG immediate release tablet Take 1 tablet (5 mg total) by mouth every 4 (four) hours. 30 tablet 0  . polyethylene glycol (MIRALAX / GLYCOLAX) packet Take 17 g by mouth daily. 14 each 0  . senna-docusate (SENOKOT-S) 8.6-50 MG tablet Take 1 tablet by mouth 2 (  two) times daily. 30 tablet 0  . zolpidem (AMBIEN) 5 MG tablet Take 1 tablet (5 mg total) by mouth at bedtime as needed for sleep. 30 tablet 0   No current facility-administered medications on file prior to visit.    Review of Systems   In general does not complain of fever or chills. Provera skin denies rashes or itching surgical site appears benign right hip.  Head ears eyes nose mouth and throat does not complaining nasal discharge or visual changes.  Her straight  denies shortness breath or cough.  Cardiac no chest pain or lower extremity edema.  GI is not complaining of nausea vomiting diarrhea constipation or abdominal pain.  Muscle skeletal at times does have right hip discomfort the oxycodone appears to be effective with this.  Neurologic is not complaining of dizziness headache or syncopal-type episodes.  Insight some previous complaints of depression and anxiety but this appears to have stabilized does have a history of bipolar disorder has been followed by psych services continues on BuSpar  Immunization History  Administered Date(s) Administered  . PPD Test 07/14/2015   Pertinent  Health Maintenance Due  Topic Date Due  . PNA vac Low Risk Adult (1 of 2 - PCV13) 02/17/2016 (Originally 11/16/2000)  . INFLUENZA VACCINE  09/17/2015   No flowsheet data found. Functional Status Survey:    Filed Vitals:   08/16/15 1040  BP: 112/70  Pulse: 75  Temp: 98.3 F (36.8 C)  TempSrc: Oral  Resp: 19  Height: 5\' 9"  (1.753 m)  Weight: 178 lb 4.8 oz (80.876 kg)   Body mass index is 26.32 kg/(m^2). Physical Exam In general this is a pleasant elderly male in no distress.  His skin is warm and dry surgical site right hip appears to be well-healed.  Eyes pupils appear reactive light he has bilateral ptosis  Oropharynx clear mucous membranes moist.  Chest is clear to auscultation there is no labored breathing.  Heart is regular rate and rhythm without definite lower extremity edema pedal pulses are intact bilaterally.  Abdomen is soft nontender with active bowel sounds.  Muscle skeletal is able to ambulate with a walker appears to be doing fairly well with this would benefit from some additional therapy.  He is able to stand from a sitting position without any assistance.  I do not note any deformities other than arthritic.  Neurologic is grossly intact to speech is clear no lateralizing findings.  Psych he is grossly alert and  oriented pleasant and conversant although he does speak somewhat slowly and measured Labs reviewed:  Recent Labs  07/12/15 0617 07/13/15 0603 07/26/15 1100  NA 139 137 134*  K 4.0 4.1 4.1  CL 107 107 101  CO2 26 24 27   GLUCOSE 137* 138* 111*  BUN 21* 20 21*  CREATININE 1.17 1.04 0.81  CALCIUM 8.7* 8.3* 9.1    Recent Labs  07/13/15 0603  AST 42*  ALT 18  ALKPHOS 53  BILITOT 0.9  PROT 4.7*  ALBUMIN 2.6*    Recent Labs  07/13/15 0603 07/19/15 0705 07/26/15 1100  WBC 11.3* 9.9 9.9  NEUTROABS 9.7* 7.5 7.8*  HGB 9.5* 10.0* 11.0*  HCT 29.3* 30.2* 32.8*  MCV 90.2 90.1 90.1  PLT 154 327 449*   No results found for: TSH No results found for: HGBA1C No results found for: CHOL, HDL, LDLCALC, LDLDIRECT, TRIG, CHOLHDL  Significant Diagnostic Results in last 30 days:  No results found.  Assessment/Plan   History of femoral  neck fracture this appears to be stable postop was uncomplicated except for seroma which was drained by orthopedics he will need continued PT and OT at home-as well as a rolling walker to assist with ambulation.  #2 anemia this appears to be stable with a hemoglobin of 11 on lab done earlier this month will update this before discharge he is on iron.  #3 history of anxiety continues on BuSpar twice a day this appears to be stabilized.  #4  Hypertension as noted above this appears stable currently not on any medication  #5 history of hyperlipidemia he is on a statin since his stay here is been short have not been aggressive pursuing a lipid panel will defer to primary care provider.  Clinically patient appears to be stable the oxycodone is helping with his pain-will need follow-up by primary care provider again he will be with his niece.  [id number] note greater than 30 minutes spent on this discharge summary-greater than 50% of time spent coordinating plan of care     Oralia Manis, [place]

## 2015-08-17 ENCOUNTER — Encounter (HOSPITAL_COMMUNITY)
Admission: RE | Admit: 2015-08-17 | Discharge: 2015-08-17 | Disposition: A | Discharge status: Home / Self Care | Payer: Medicare Other | Source: Skilled Nursing Facility | Attending: Internal Medicine | Admitting: Internal Medicine

## 2015-08-17 DIAGNOSIS — I10 Essential (primary) hypertension: Secondary | ICD-10-CM | POA: Diagnosis not present

## 2015-08-17 LAB — CBC WITH DIFFERENTIAL/PLATELET
BASOS ABS: 0 10*3/uL (ref 0.0–0.1)
Basophils Relative: 0 %
Eosinophils Absolute: 0.4 10*3/uL (ref 0.0–0.7)
Eosinophils Relative: 4 %
HEMATOCRIT: 37.7 % — AB (ref 39.0–52.0)
HEMOGLOBIN: 12.2 g/dL — AB (ref 13.0–17.0)
LYMPHS ABS: 2.1 10*3/uL (ref 0.7–4.0)
LYMPHS PCT: 22 %
MCH: 29.9 pg (ref 26.0–34.0)
MCHC: 32.4 g/dL (ref 30.0–36.0)
MCV: 92.4 fL (ref 78.0–100.0)
Monocytes Absolute: 0.7 10*3/uL (ref 0.1–1.0)
Monocytes Relative: 7 %
NEUTROS ABS: 6.2 10*3/uL (ref 1.7–7.7)
Neutrophils Relative %: 67 %
PLATELETS: 269 10*3/uL (ref 150–400)
RBC: 4.08 MIL/uL — AB (ref 4.22–5.81)
RDW: 14.5 % (ref 11.5–15.5)
WBC: 9.3 10*3/uL (ref 4.0–10.5)

## 2015-08-17 LAB — BASIC METABOLIC PANEL
ANION GAP: 3 — AB (ref 5–15)
BUN: 22 mg/dL — ABNORMAL HIGH (ref 6–20)
CHLORIDE: 107 mmol/L (ref 101–111)
CO2: 26 mmol/L (ref 22–32)
Calcium: 8.9 mg/dL (ref 8.9–10.3)
Creatinine, Ser: 0.88 mg/dL (ref 0.61–1.24)
GFR calc Af Amer: >60 mL/min (ref >60)
Glucose, Bld: 80 mg/dL (ref 65–99)
POTASSIUM: 4.3 mmol/L (ref 3.5–5.1)
SODIUM: 136 mmol/L (ref 135–145)

## 2015-10-23 ENCOUNTER — Ambulatory Visit: Payer: Medicare Other | Admitting: Urology

## 2016-06-29 ENCOUNTER — Ambulatory Visit (INDEPENDENT_AMBULATORY_CARE_PROVIDER_SITE_OTHER): Payer: Medicare Other | Admitting: Physician Assistant

## 2016-07-20 ENCOUNTER — Ambulatory Visit: Payer: Medicare Other | Admitting: Family Medicine

## 2016-09-10 ENCOUNTER — Encounter: Payer: Self-pay | Admitting: Family Medicine

## 2016-09-10 ENCOUNTER — Ambulatory Visit (INDEPENDENT_AMBULATORY_CARE_PROVIDER_SITE_OTHER): Payer: Medicare HMO | Admitting: Family Medicine

## 2016-09-10 VITALS — BP 142/92 | HR 100 | Temp 98.6°F | Resp 16 | Ht 68.0 in | Wt 181.1 lb

## 2016-09-10 DIAGNOSIS — F429 Obsessive-compulsive disorder, unspecified: Secondary | ICD-10-CM

## 2016-09-10 DIAGNOSIS — F422 Mixed obsessional thoughts and acts: Secondary | ICD-10-CM

## 2016-09-10 DIAGNOSIS — F411 Generalized anxiety disorder: Secondary | ICD-10-CM | POA: Diagnosis not present

## 2016-09-10 DIAGNOSIS — R2689 Other abnormalities of gait and mobility: Secondary | ICD-10-CM

## 2016-09-10 DIAGNOSIS — I10 Essential (primary) hypertension: Secondary | ICD-10-CM | POA: Diagnosis not present

## 2016-09-10 LAB — COMPLETE METABOLIC PANEL WITH GFR
ALBUMIN: 4 g/dL (ref 3.6–5.1)
ALK PHOS: 100 U/L (ref 40–115)
ALT: 18 U/L (ref 9–46)
AST: 23 U/L (ref 10–35)
BILIRUBIN TOTAL: 0.7 mg/dL (ref 0.2–1.2)
BUN: 22 mg/dL (ref 7–25)
CO2: 25 mmol/L (ref 20–31)
CREATININE: 0.94 mg/dL (ref 0.70–1.11)
Calcium: 9.5 mg/dL (ref 8.6–10.3)
Chloride: 103 mmol/L (ref 98–110)
GFR, EST AFRICAN AMERICAN: 88 mL/min (ref >=60)
GFR, Est Non African American: 76 mL/min (ref >=60)
GLUCOSE: 104 mg/dL — AB (ref 65–99)
Potassium: 4.5 mmol/L (ref 3.5–5.3)
SODIUM: 137 mmol/L (ref 135–146)
TOTAL PROTEIN: 7 g/dL (ref 6.1–8.1)

## 2016-09-10 LAB — LIPID PANEL
Cholesterol: 166 mg/dL (ref <200)
HDL: 85 mg/dL (ref >40)
LDL Cholesterol: 72 mg/dL (ref <100)
Total CHOL/HDL Ratio: 2 Ratio (ref <5.0)
Triglycerides: 45 mg/dL (ref <150)
VLDL: 9 mg/dL (ref <30)

## 2016-09-10 LAB — CBC
HCT: 42.6 % (ref 38.5–50.0)
HEMOGLOBIN: 14.3 g/dL (ref 13.2–17.1)
MCH: 29.9 pg (ref 27.0–33.0)
MCHC: 33.6 g/dL (ref 32.0–36.0)
MCV: 88.9 fL (ref 80.0–100.0)
MPV: 11.1 fL (ref 7.5–12.5)
PLATELETS: 289 10*3/uL (ref 140–400)
RBC: 4.79 MIL/uL (ref 4.20–5.80)
RDW: 14.5 % (ref 11.0–15.0)
WBC: 8.6 10*3/uL (ref 3.8–10.8)

## 2016-09-10 MED ORDER — CLOMIPRAMINE HCL 50 MG PO CAPS: 50.0000 mg | ORAL_CAPSULE | Freq: Every day | ORAL | 2 refills | Status: DC

## 2016-09-10 MED ORDER — PAROXETINE HCL 20 MG PO TABS: 20.0000 mg | ORAL_TABLET | Freq: Every day | ORAL | 1 refills | Status: DC

## 2016-09-10 NOTE — Progress Notes (Signed)
Chief Complaint  Patient presents with  . Hypertension  83 year old gentleman, never married, lives with a niece and her family. This is their first visit here, Shawn Gomez accompanied by his sister Shawn Gomez and his niece and caregiver Shawn Gomez Unfortunately, they have a family crisis over his behavior and it is uncertain how long he can continue to live with family. He previously lived with Shawn Gomez and her husband, but they tired of his behaviors. He currently lives with Shawn Gomez and her family and she is at her wits end tolerating him. He has severe OCD. Currently treated with citalopram 20 mg a day and clomipramine 25 mg a day. His symptoms are uncontrolled. He wanders through the house day and night collecting Objects that belong to other people. He then hides them in his room. When questioned, he denies taking anything. I explained that hoarding behavior is common in OCD patients.  He does not recognize that his behavior is wrong. He is also paranoid, looks out the windows and thinks people are watching him. He goes to an adult care facility 5 days a week. This facility has learned they must "frisk" him before he leaves every day to see what he has taken in his pockets and up his sleeves. I explained that he needs a change of medication. In addition he needs to be under the care of a psychiatrist. I asked the family if they could hold on for a while with his current behavior, and not place him at home. They will try. He has a history of hypertension. History of hyperlipidemia. He walks with a walker. He has an unsteady gait. He has a history of falls. He fractured his hip in 2017 and required surgery. He has no pain.    Patient Active Problem List   Diagnosis Date Noted  . OCD (obsessive compulsive disorder) 09/10/2016  . Need for assistance due to unsteady gait 09/10/2016  . Anxiety state 07/16/2015  . Hyperlipidemia 07/11/2015  . Essential hypertension 07/11/2015  . ADENOMATOUS COLONIC POLYP 05/02/2009     Outpatient Encounter Prescriptions as of 09/10/2016  Medication Sig  . aspirin EC 325 MG EC tablet Take 1 tablet (325 mg total) by mouth 2 (two) times daily after a meal.  . atorvastatin (LIPITOR) 40 MG tablet Take 40 mg by mouth daily.  . citalopram (CELEXA) 20 MG tablet TK 1 T PO QD  . [DISCONTINUED] clomiPRAMINE (ANAFRANIL) 25 MG capsule TK 1 C PO QHS  . clomiPRAMINE (ANAFRANIL) 50 MG capsule Take 1 capsule (50 mg total) by mouth at bedtime.  Marland Kitchen PARoxetine (PAXIL) 20 MG tablet Take 1 tablet (20 mg total) by mouth daily.   No facility-administered encounter medications on file as of 09/10/2016.     Past Medical History:  Diagnosis Date  . Anemia   . Anxiety   . Arthritis    knees  . Cataract   . Depression   . HTN (hypertension)   . Hyperlipidemia     Past Surgical History:  Procedure Laterality Date  . adenomatous polypectomy  2011  . ANTERIOR CRUCIATE LIGAMENT REPAIR Left   . CATARACT EXTRACTION W/PHACO Right 06/14/2014   Procedure: CATARACT EXTRACTION PHACO AND INTRAOCULAR LENS PLACEMENT RIGHT EYE;  Surgeon: Tonny Branch, MD;  Location: [facility];  Service: Ophthalmology;  Laterality: Right;  CDE 5.47  . CATARACT EXTRACTION W/PHACO Left 07/12/2014   Procedure: CATARACT EXTRACTION PHACO AND INTRAOCULAR LENS PLACEMENT: CDE:  5.63;  Surgeon: Tonny Branch, MD;  Location: [facility];  Service: Ophthalmology;  Laterality: Left;  . HERNIA REPAIR Left    Inguinal  . TONSILLECTOMY    . TOTAL HIP ARTHROPLASTY Right 07/11/2015   Procedure: TOTAL HIP ARTHROPLASTY ANTERIOR APPROACH;  Surgeon: Mcarthur Rossetti, MD;  Location: [facility];  Service: Orthopedics;  Laterality: Right;    Social History   Social History  . Marital status: Single    Spouse name: 0  . Number of children: 0  . Years of education: 8   Occupational History  . retired     drove truck, [place] History Main Topics  . Smoking status: Never Smoker  . Smokeless tobacco: Never Used  . Alcohol use Yes      Comment: Occasional beer  . Drug use: No  . Sexual activity: Not Currently   Other Topics Concern  . Not on file   Social History Narrative   Lives with Shawn Gomez and her husband   Shawn Gomez is niece, is a LPN, she is his caregiver   Shawn Gomez has 4 children, 3 still at home   Goes to [employer] center 5 d a week ([facility])    Family History  Problem Relation Age of Onset  . Stroke Mother   . Heart disease Mother   . Seizures Father   . Heart disease Father   . Mental illness Sister        bipolar  . COPD Brother   . Heart disease Brother   . Early death Brother 61       sudden death  . Cancer Sister        lung  . Diabetes Neg Hx        only in nephew    Review of Systems  Constitutional: Negative for chills, fever and weight loss.  HENT: Positive for hearing loss. Negative for congestion.   Eyes: Negative for blurred vision and pain.  Respiratory: Negative for cough and shortness of breath.   Cardiovascular: Negative for chest pain and leg swelling.  Gastrointestinal: Negative for abdominal pain, constipation, diarrhea and heartburn.  Genitourinary: Negative for dysuria and frequency.  Musculoskeletal: Positive for falls. Negative for joint pain and myalgias.  Neurological: Positive for weakness. Negative for dizziness, seizures and headaches.  Psychiatric/Behavioral: Positive for hallucinations. Negative for depression and memory loss. The patient is nervous/anxious and has insomnia.     BP (!) 142/92 (BP Location: Left Arm, Patient Position: Sitting, Cuff Size: Normal)   Pulse 100   Temp 98.6 F (37 C) (Temporal)   Resp 16   Ht 5\' 8"  (1.727 m)   Wt 181 lb 1.3 oz (82.1 kg)   SpO2 97%   BMI 27.53 kg/m   Physical Exam  Constitutional: He is oriented to person, place, and time. He appears well-developed and well-nourished. He appears distressed.  Anxious, jittery. Unsteady gait. Uses walker.  HENT:  Head: Normocephalic and atraumatic.  Mouth/Throat: Oropharynx is clear  and moist.  Hard of hearing  Eyes: Pupils are equal, round, and reactive to light. Conjunctivae are normal.  Neck: Normal range of motion.  Cardiovascular: Normal rate, regular rhythm and normal heart sounds.   Pulmonary/Chest: Effort normal and breath sounds normal. No respiratory distress. He has no wheezes.  Abdominal: Soft. Bowel sounds are normal. There is no tenderness.  Musculoskeletal: He exhibits no edema.  No focal weakness  Lymphadenopathy:    He has no cervical adenopathy.  Neurological: He is alert and oriented to person, place, and time. Coordination abnormal.  Skin: Skin is warm  and dry. No rash noted.  Psychiatric: His speech is normal and behavior is normal. His mood appears anxious. His affect is labile. Cognition and memory are normal. He expresses inappropriate judgment. He exhibits normal recent memory.  Clock drawing and 3 word recall were normal. Intelligent. Completely unaware that his behavior is not normal. Thinks he can change based on one discussion. He is inattentive.   ASSESSMENT/PLAN:  1. Essential hypertension - CBC - COMPLETE METABOLIC PANEL WITH GFR - Lipid panel - VITAMIN D 25 Hydroxy (Vit-D Deficiency, Fractures) - Urinalysis, Routine w reflex microscopic  2. Mixed obsessional thoughts and acts  3. Obsessive-compulsive disorder, unspecified type Patient is referred for psychiatry consultation. Citalopram ineffective. We'll try Paxil. Clomipramine underdose to 25 mg. Will try 50. Family asks for a sedative for afternoons/evenings. I decline and them know this is not safe. Hopefully the medication changes were help him  4. Anxiety state   5. Need for assistance due to unsteady gait  Greater than 50% of this visit was spent in counseling and coordinating care.  Total face to face time:   60 minutes was spent in discussing the family dynamics, patient's mental illness, expectations, medications, and referral    Patient Instructions  Need old  records Dr Cindie Laroche  I have placed referral to psychology  Stop the citalopram Start the paroxetine  Increase the clomipramine to 50 mg a day  See me in one month  Call sooner for problems   Raylene Everts, MD

## 2016-09-10 NOTE — Patient Instructions (Signed)
Need old records Dr Cindie Laroche  I have placed referral to psychology  Stop the citalopram Start the paroxetine  Increase the clomipramine to 50 mg a day  See me in one month  Call sooner for problems

## 2016-09-11 ENCOUNTER — Telehealth: Payer: Self-pay

## 2016-09-11 ENCOUNTER — Encounter: Payer: Self-pay | Admitting: Family Medicine

## 2016-09-11 LAB — URINALYSIS, ROUTINE W REFLEX MICROSCOPIC
Bilirubin Urine: NEGATIVE
GLUCOSE, UA: NEGATIVE
Hgb urine dipstick: NEGATIVE
Ketones, ur: NEGATIVE
LEUKOCYTES UA: NEGATIVE
Nitrite: NEGATIVE
PH: 5 (ref 5.0–8.0)
Protein, ur: NEGATIVE
Specific Gravity, Urine: 1.023 (ref 1.001–1.035)

## 2016-09-11 LAB — VITAMIN D 25 HYDROXY (VIT D DEFICIENCY, FRACTURES): Vit D, 25-Hydroxy: 29 ng/mL — ABNORMAL LOW (ref 30–100)

## 2016-09-11 NOTE — Telephone Encounter (Signed)
He has been on this for years.Marland KitchenMarland KitchenI just doubled the dose.  Perhaps he could take 2 of the 25 mg tabs and it would be cheaper??

## 2016-09-11 NOTE — Telephone Encounter (Signed)
No message

## 2016-09-11 NOTE — Telephone Encounter (Signed)
Sister called stating clomipramine was over $100.00 Asking for something cheaper.

## 2016-09-15 ENCOUNTER — Telehealth: Payer: Self-pay | Admitting: *Deleted

## 2016-09-15 ENCOUNTER — Other Ambulatory Visit: Payer: Self-pay | Admitting: Family Medicine

## 2016-09-15 MED ORDER — CLOMIPRAMINE HCL 25 MG PO CAPS: 25.0000 mg | ORAL_CAPSULE | Freq: Every day | ORAL | Status: DC

## 2016-09-15 NOTE — Telephone Encounter (Signed)
Discussed placement at a senior apartment

## 2016-09-15 NOTE — Telephone Encounter (Signed)
Annabelle Harman called about patient, Santiago Glad stated she sent Dr Meda Coffee a message on mychart, and would like to speak with Dr Meda Coffee only. Please call Santiago Glad at [phone]

## 2016-09-16 ENCOUNTER — Telehealth: Payer: Self-pay | Admitting: Family Medicine

## 2016-09-16 MED ORDER — CLOMIPRAMINE HCL 25 MG PO CAPS: 50.0000 mg | ORAL_CAPSULE | Freq: Every day | ORAL | Status: DC

## 2016-09-16 NOTE — Telephone Encounter (Signed)
°  Patient's sister Santiago Glad calling to request refill for the following:   clomiPRAMINE (ANAFRANIL) 25 MG capsule  [facility] in [place]

## 2016-09-17 ENCOUNTER — Other Ambulatory Visit: Payer: Self-pay

## 2016-09-17 MED ORDER — CLOMIPRAMINE HCL 25 MG PO CAPS: 50.0000 mg | ORAL_CAPSULE | Freq: Every day | ORAL | Status: DC

## 2016-09-21 NOTE — Telephone Encounter (Signed)
I called and spoke to pts sister. They have picked up meds, but are only going to be giving him 25 mg daily of clomipramine d/t [place] hoarding meds and unsure if hes actually been taking anything.

## 2016-09-30 ENCOUNTER — Telehealth (HOSPITAL_COMMUNITY): Payer: Self-pay | Admitting: *Deleted

## 2016-09-30 NOTE — Telephone Encounter (Signed)
attempt to contact patient, line busy.

## 2016-10-20 ENCOUNTER — Ambulatory Visit: Payer: Medicare HMO | Admitting: Family Medicine

## 2016-10-27 ENCOUNTER — Telehealth (HOSPITAL_COMMUNITY): Payer: Self-pay | Admitting: *Deleted

## 2016-10-27 ENCOUNTER — Encounter: Payer: Self-pay | Admitting: Family Medicine

## 2016-10-27 ENCOUNTER — Ambulatory Visit (INDEPENDENT_AMBULATORY_CARE_PROVIDER_SITE_OTHER): Payer: Medicare HMO | Admitting: Family Medicine

## 2016-10-27 VITALS — BP 138/84 | HR 88 | Temp 98.6°F | Resp 18 | Ht 69.0 in | Wt 185.0 lb

## 2016-10-27 DIAGNOSIS — Z23 Encounter for immunization: Secondary | ICD-10-CM | POA: Diagnosis not present

## 2016-10-27 DIAGNOSIS — F422 Mixed obsessional thoughts and acts: Secondary | ICD-10-CM | POA: Diagnosis not present

## 2016-10-27 MED ORDER — ATORVASTATIN CALCIUM 40 MG PO TABS: 40.0000 mg | ORAL_TABLET | Freq: Every day | ORAL | 3 refills | Status: AC

## 2016-10-27 NOTE — Telephone Encounter (Signed)
phone call regarding an appointment for patient.   Spoke with his sister who says she has POA over patient.    Offered an appointment for 10/29/16.   She said they cannot come that day, they have appointments already.   She said Ava is going to call her regarding an appointment.

## 2016-10-27 NOTE — Progress Notes (Signed)
Chief Complaint  Patient presents with  . Follow-up    1 month.. med issues, confusion   Is living at an assisted living facility Calls family several times a day, sometimes at odd hours He also visits the facility office up to 5 times a day to ask about his medicine  Is still displaying obsessive behavior Has not yet seen the psychiatrist, we called again about his referral Uncertain about his medicine compliance as he is taking them on his own right now.  No way to monitor.  His pill counts are usually off.  He previously hid pills and threw them away. He has gained weight. No falls or illness Is here with sister and niece.   Patient Active Problem List   Diagnosis Date Noted  . OCD (obsessive compulsive disorder) 09/10/2016  . Need for assistance due to unsteady gait 09/10/2016  . Anxiety state 07/16/2015  . Hyperlipidemia 07/11/2015  . Essential hypertension 07/11/2015  . ADENOMATOUS COLONIC POLYP 05/02/2009    Outpatient Encounter Prescriptions as of 10/27/2016  Medication Sig  . aspirin EC 325 MG EC tablet Take 1 tablet (325 mg total) by mouth 2 (two) times daily after a meal.  . atorvastatin (LIPITOR) 40 MG tablet Take 1 tablet (40 mg total) by mouth daily.  . clomiPRAMINE (ANAFRANIL) 25 MG capsule Take 2 capsules (50 mg total) by mouth at bedtime.  Marland Kitchen PARoxetine (PAXIL) 20 MG tablet Take 1 tablet (20 mg total) by mouth daily.   No facility-administered encounter medications on file as of 10/27/2016.     No Known Allergies  Review of Systems  Constitutional: Positive for unexpected weight change. Negative for activity change and appetite change.  HENT: Negative for congestion, dental problem and trouble swallowing.   Eyes: Negative for photophobia and visual disturbance.  Respiratory: Negative for cough and choking.   Cardiovascular: Negative for chest pain and leg swelling.  Gastrointestinal: Negative for constipation and diarrhea.  Genitourinary: Negative for  difficulty urinating and frequency.  Musculoskeletal: Positive for gait problem. Negative for arthralgias.       Poor balance  Neurological: Negative for dizziness and headaches.  Psychiatric/Behavioral: Positive for behavioral problems, confusion and sleep disturbance. Negative for dysphoric mood and suicidal ideas. The patient is nervous/anxious. The patient is not hyperactive.     BP 138/84 (BP Location: Right Arm, Patient Position: Sitting, Cuff Size: Normal)   Pulse 88   Temp 98.6 F (37 C) (Temporal)   Resp 18   Ht 5\' 9"  (1.753 m)   Wt 185 lb 0.6 oz (83.9 kg)   SpO2 95%   BMI 27.33 kg/m   Physical Exam  Constitutional: He appears well-developed and well-nourished. He appears distressed.  HENT:  Head: Normocephalic and atraumatic.  Mouth/Throat: Oropharynx is clear and moist.  Eyes: Pupils are equal, round, and reactive to light. Conjunctivae are normal.  Cardiovascular: Normal rate, regular rhythm and normal heart sounds.   Pulmonary/Chest: Effort normal and breath sounds normal.  Neurological: He is alert. Coordination abnormal.  Psychiatric:  Inattentive.  Poor eye contact.  Poor judgement. Anxious.    ASSESSMENT/PLAN:  1. Mixed obsessional thoughts and acts Unchanged.  Questionable compliance  2. Need for influenza vaccination - Flu Vaccine QUAD 36+ mos IM  3. Need for pneumococcal vaccination - Pneumococcal conjugate vaccine 13-valent IM  Discussed with patient and family what they can do to assure compliance.  Recommend taking the pills all once a day.  Recommend getting an alarm clock to go off  each day to remind.  He should use a M-T-W-T F S-S pill box for easy identification .  Will work on psych eval to maximize appropriate medicine. Stay at assisted living with understanding that if they cannot adjust to his behavior he may need alternate placement.  Patient Instructions  You may take all the pills once a day in the evening Take after dinner and before  bed No morning pills  We will work on the psychiatry referral  See me in one month   Raylene Everts, MD

## 2016-10-27 NOTE — Patient Instructions (Signed)
You may take all the pills once a day in the evening Take after dinner and before bed No morning pills  We will work on the psychiatry referral  See me in one month

## 2016-11-16 ENCOUNTER — Other Ambulatory Visit: Payer: Self-pay | Admitting: Family Medicine

## 2016-11-18 NOTE — Telephone Encounter (Signed)
Seen 9 11 18

## 2016-11-20 IMAGING — DX DG LUMBAR SPINE COMPLETE 4+V
4 series · 4 of 4 positions shown · non-contrast
Comparison: None.

CLINICAL DATA: Low back and right hip pain after slipping and
falling on a wet surface today.

EXAM:
LUMBAR SPINE - COMPLETE 4+ VIEW

[l-spine ap]
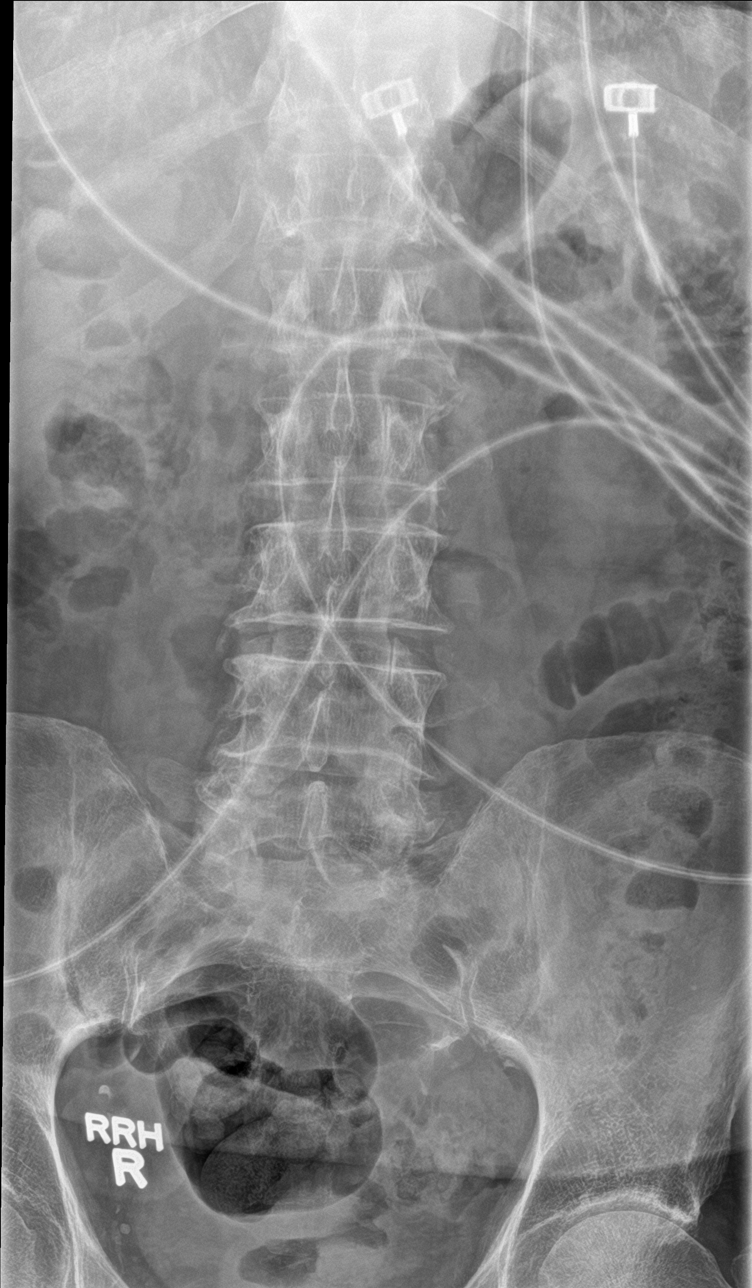

[l-spine obl (1 of 2)]
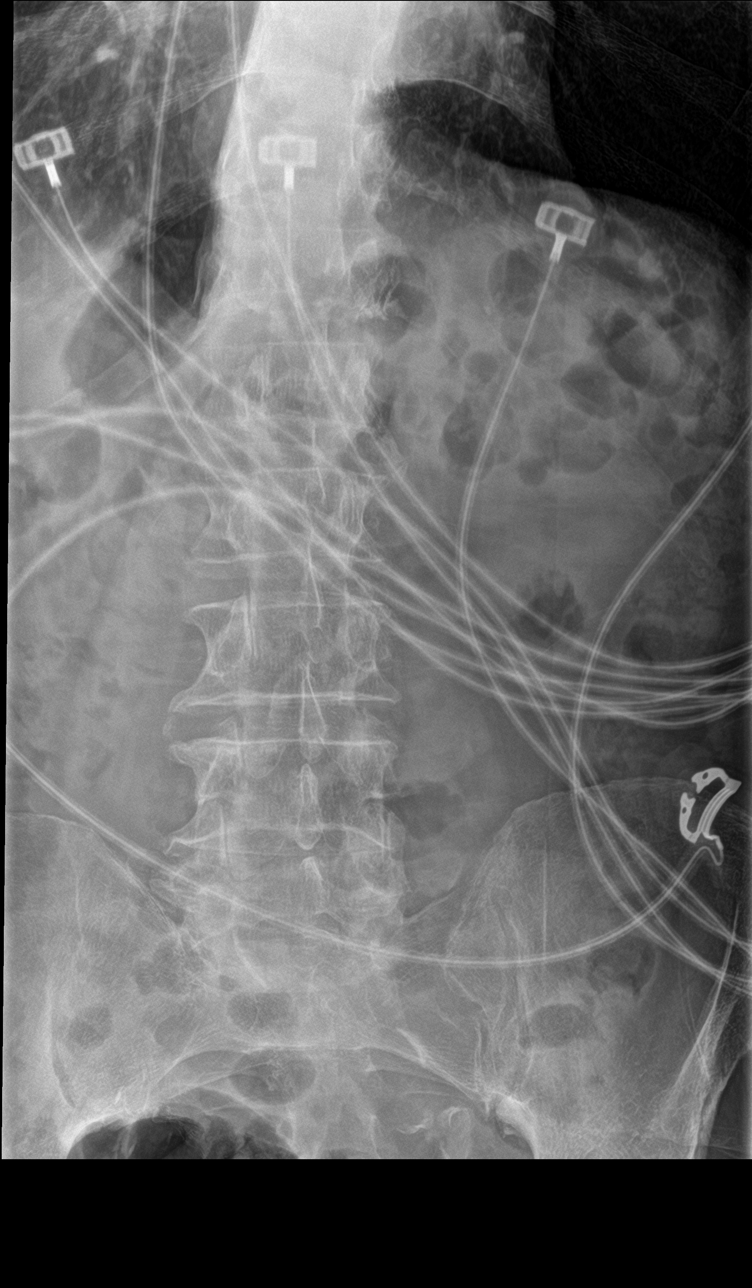

[l-spine obl (2 of 2)]
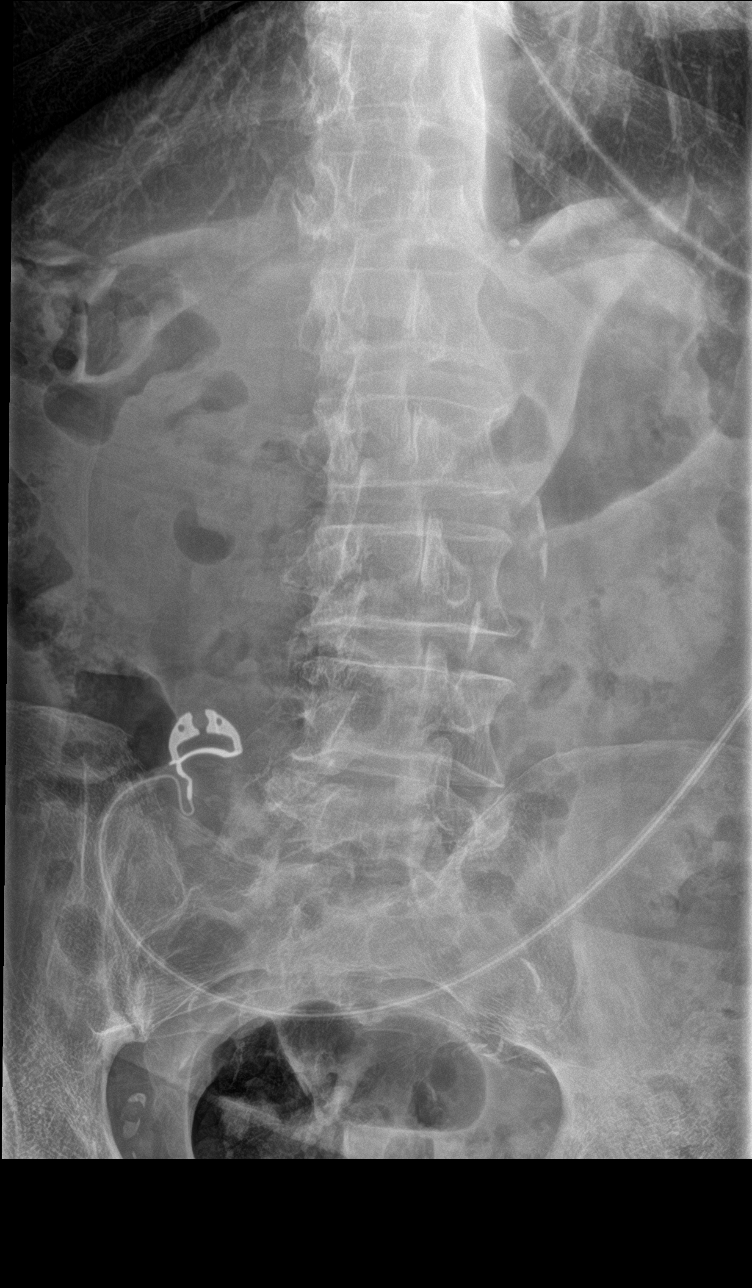

[l-spine lat]
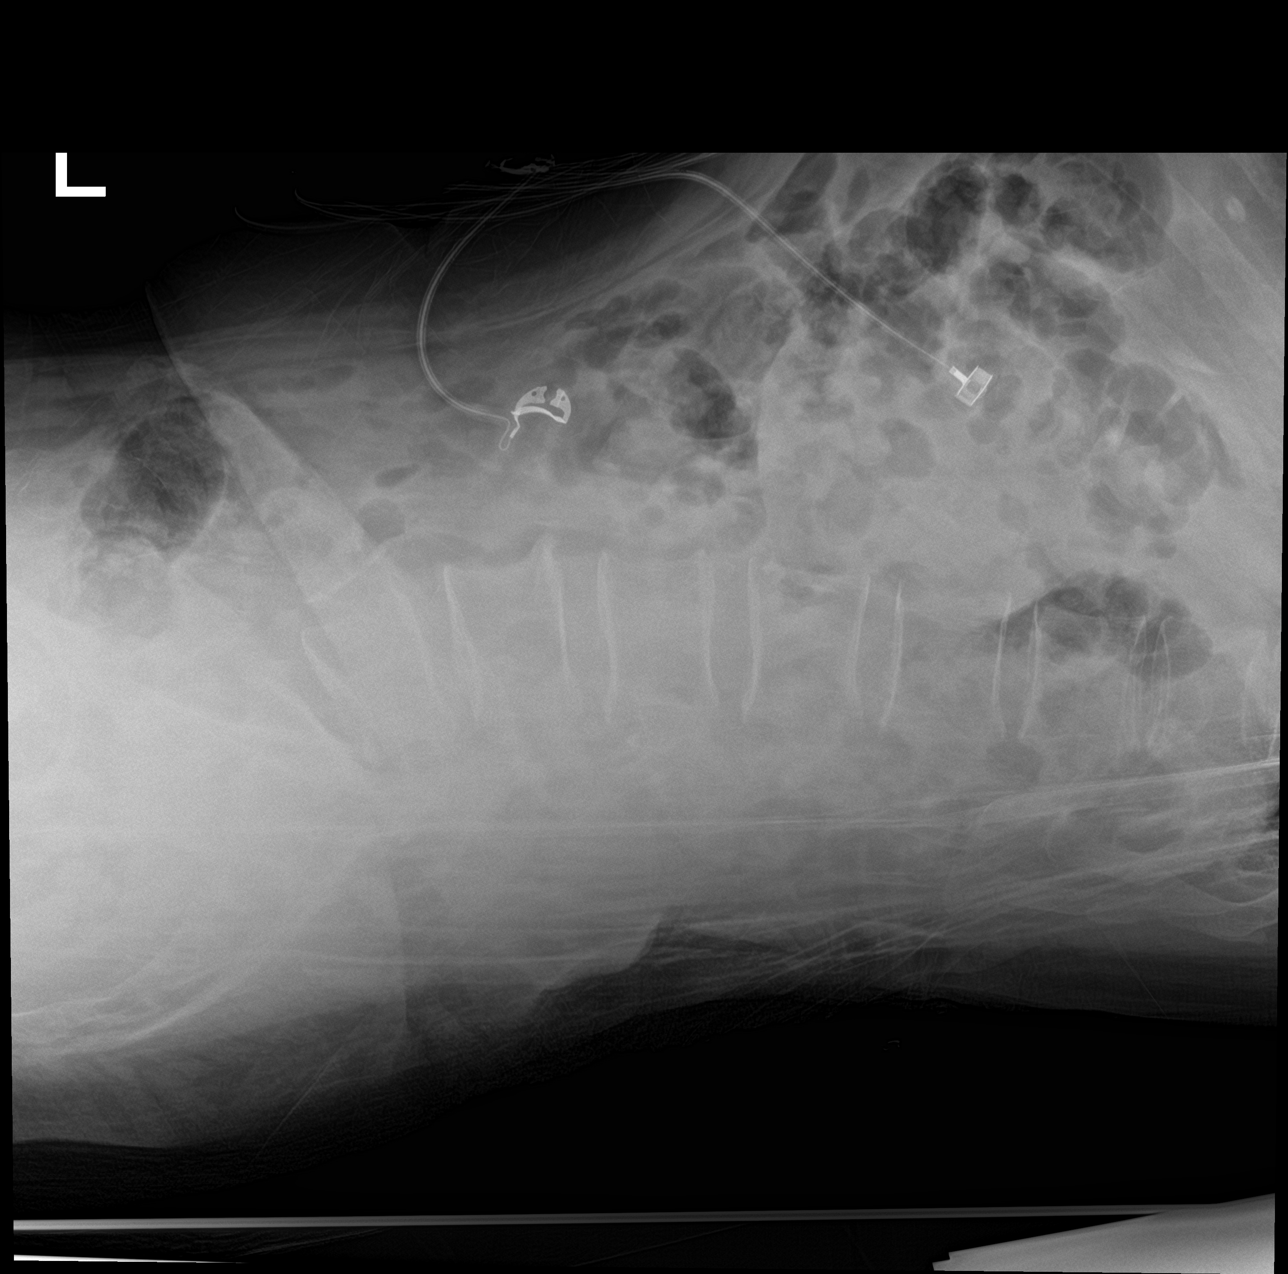

[4 of 4 positions shown; findings below may reference images not displayed]

FINDINGS: Five non-rib-bearing lumbar vertebrae. Mild to moderate lateral spur
formation at multiple levels. Mild to moderate anterior spur
formation at the L3-4 level. No fractures, subluxations or pars
defects are seen.
IMPRESSION: 1. No fracture or subluxation.
2. Degenerative changes.

## 2016-11-23 NOTE — Progress Notes (Deleted)
Psychiatric Initial Adult Assessment   Patient Identification: Shawn Gomez MRN:  [id number] Date of Evaluation:  11/23/2016 Referral Source: *** Chief Complaint:   Visit Diagnosis: No diagnosis found.  History of Present Illness:   Shawn Gomez is a 83 year old male with OCD, hypertension, dyslipidemia, history of femoral neck fracture due to mechanical fall, who is referred for OCD.  tertialy amine  Per chart review, MMSE 22-25/30 in 06/2015.  Associated Signs/Symptoms: Depression Symptoms:  {DEPRESSION SYMPTOMS:20000} (Hypo) Manic Symptoms:  {BHH MANIC SYMPTOMS:22872} Anxiety Symptoms:  {BHH ANXIETY SYMPTOMS:22873} Psychotic Symptoms:  {BHH PSYCHOTIC SYMPTOMS:22874} PTSD Symptoms: {BHH PTSD SYMPTOMS:22875}  Past Psychiatric History:  Outpatient:  Psychiatry admission:  Previous suicide attempt:  Past trials of medication:  History of violence:   Previous Psychotropic Medications: {YES/NO:21197}  Substance Abuse History in the last 12 months:  {yes no:314532}  Consequences of Substance Abuse: {BHH CONSEQUENCES OF SUBSTANCE ABUSE:22880}  Past Medical History:  Past Medical History:  Diagnosis Date  . Anemia   . Anxiety   . Arthritis    knees  . Cataract   . Depression   . HTN (hypertension)   . Hyperlipidemia     Past Surgical History:  Procedure Laterality Date  . adenomatous polypectomy  2011  . ANTERIOR CRUCIATE LIGAMENT REPAIR Left   . CATARACT EXTRACTION W/PHACO Right 06/14/2014   Procedure: CATARACT EXTRACTION PHACO AND INTRAOCULAR LENS PLACEMENT RIGHT EYE;  Surgeon: Tonny Branch, MD;  Location: [facility];  Service: Ophthalmology;  Laterality: Right;  CDE 5.47  . CATARACT EXTRACTION W/PHACO Left 07/12/2014   Procedure: CATARACT EXTRACTION PHACO AND INTRAOCULAR LENS PLACEMENT: CDE:  5.63;  Surgeon: Tonny Branch, MD;  Location: [facility];  Service: Ophthalmology;  Laterality: Left;  . HERNIA REPAIR Left    Inguinal  . TONSILLECTOMY    . TOTAL HIP  ARTHROPLASTY Right 07/11/2015   Procedure: TOTAL HIP ARTHROPLASTY ANTERIOR APPROACH;  Surgeon: Mcarthur Rossetti, MD;  Location: [facility];  Service: Orthopedics;  Laterality: Right;    Family Psychiatric History: ***  Family History:  Family History  Problem Relation Age of Onset  . Stroke Mother   . Heart disease Mother   . Seizures Father   . Heart disease Father   . Mental illness Sister        bipolar  . COPD Brother   . Heart disease Brother   . Early death Brother 34       sudden death  . Cancer Sister        lung  . Diabetes Neg Hx        only in nephew    Social History:   Social History   Social History  . Marital status: Single    Spouse name: 0  . Number of children: 0  . Years of education: 8   Occupational History  . retired     drove truck, [place] History Main Topics  . Smoking status: Never Smoker  . Smokeless tobacco: Never Used  . Alcohol use Yes     Comment: Occasional beer  . Drug use: No  . Sexual activity: Not Currently   Other Topics Concern  . Not on file   Social History Narrative   Moved out of family home   Living in assisted living center    Additional Social History: ***  Allergies:  No Known Allergies  Metabolic Disorder Labs: No results found for: HGBA1C, MPG No results found for: PROLACTIN Lab Results  Component Value Date   CHOL 166 09/10/2016   TRIG 45 09/10/2016   HDL 85 09/10/2016   CHOLHDL 2.0 09/10/2016   VLDL 9 09/10/2016   LDLCALC 72 09/10/2016     Current Medications: Current Outpatient Prescriptions  Medication Sig Dispense Refill  . aspirin EC 325 MG EC tablet Take 1 tablet (325 mg total) by mouth 2 (two) times daily after a meal. 30 tablet 0  . atorvastatin (LIPITOR) 40 MG tablet Take 1 tablet (40 mg total) by mouth daily. 90 tablet 3  . clomiPRAMINE (ANAFRANIL) 25 MG capsule TAKE 2 CAPSULES(50 MG) BY MOUTH AT BEDTIME 60 capsule 0  . PARoxetine (PAXIL) 20 MG tablet TAKE 1 TABLET(20 MG)  BY MOUTH DAILY 30 tablet 2   No current facility-administered medications for this visit.     Neurologic: Headache: No Seizure: No Paresthesias:No  Musculoskeletal: Strength & Muscle Tone: within normal limits Gait & Station: normal Patient leans: N/A  Psychiatric Specialty Exam: ROS  There were no vitals taken for this visit.There is no height or weight on file to calculate BMI.  General Appearance: Fairly Groomed  Eye Contact:  Good  Speech:  Clear and Coherent  Volume:  Normal  Mood:  {BHH MOOD:22306}  Affect:  {Affect (PAA):22687}  Thought Process:  Coherent and Goal Directed  Orientation:  Full (Time, Place, and Person)  Thought Content:  Logical  Suicidal Thoughts:  {ST/HT (PAA):22692}  Homicidal Thoughts:  {ST/HT (PAA):22692}  Memory:  Immediate;   Good Recent;   Good Remote;   Good  Judgement:  {Judgement (PAA):22694}  Insight:  {Insight (PAA):22695}  Psychomotor Activity:  Normal  Concentration:  Concentration: Good and Attention Span: Good  Recall:  Good  Fund of Knowledge:Good  Language: Good  Akathisia:  No  Handed:  Right  AIMS (if indicated):  N/A  Assets:  Communication Skills Desire for Improvement  ADL's:  Intact  Cognition: WNL  Sleep:  ***   Assessment  Plan  The patient demonstrates the following risk factors for suicide: Chronic risk factors for suicide include: {Chronic Risk Factors for UDJSHFW:26378588}. Acute risk factors for suicide include: {Acute Risk Factors for FOYDXAJ:28786767}. Protective factors for this patient include: {Protective Factors for Suicide MCNO:70962836}. Considering these factors, the overall suicide risk at this point appears to be {Desc; low/moderate/high:110033}. Patient {ACTION; IS/IS OQH:47654650} appropriate for outpatient follow up.   Treatment Plan Summary: Plan as above   [name], MD 10/8/20181:38 PM

## 2016-11-26 ENCOUNTER — Ambulatory Visit (HOSPITAL_COMMUNITY): Payer: Self-pay | Admitting: Psychiatry

## 2016-11-27 ENCOUNTER — Ambulatory Visit: Payer: Medicare HMO | Admitting: Family Medicine

## 2016-12-01 NOTE — Progress Notes (Addendum)
Psychiatric Initial Adult Assessment   Patient Identification: Shawn Gomez MRN:  [id number] Date of Evaluation:  12/07/2016 Referral Source: Dr. Julien Nordmann Chief Complaint:   Chief Complaint    Psychiatric Evaluation; Other     Visit Diagnosis:    ICD-10-CM   1. Mood disorder (HCC) F39     History of Present Illness:   Shawn Gomez is a 83 year old male with OCD, hypertension, dyslipidemia, history of femoral neck fracture due to mechanical fall, who is referred for OCD.  -Per note from [facility]. "He is very hyperactive with o reasonable results. OCD on an excessive level that doesn't allow him to relax until he is finally exhausted." Another behavior includes "look over his shoulder as if someone is there or calling him" "go through other peoples belongings, takes thins, lies constantly and totally ungrateful for anything done for him." -Per chart review, [facility]SE 22-25/30 in 06/2015.  He presented with his niece and his sister. He is a very poor historian and he tends to provide very vague history of get circumstantial/derailment when asked to elaborate it.  He could not tell the reason to come here but states he broke his hip. He feels "depressed" about "what is happening." When he is asked to elaborate it, he then mumbles "concentrate too much, thinking all night long." When he is asked about the stay at [facility], he initially reports that he is not planning to "stay" there, although he does not have any plans to go elsewhere. Although he agreed that he had "guilt" when his niece talked about it, he then continues that "I'm trying to  think about what is the name of the city (the question he was asked a minute ago)."  His nice and sister states that he had history of "playing games" for many years. He was discharged to niece's place from rehab facility as he "faked" dementia when he was evaluated by providers. The niece states that the patient actually told her that he faked  it as he was fairly doing well at her place. He was able to take care of his hygiene when he was informed to leave the place if he is unable to. He lives in [place], independent retirement house. They had to hire people to take care of his medication and his hygiene as he has been unable to do so. They wondered if it is because he does not want to stay in the place. They discussed with him that he needs to move to nursing facility if he is unable to stay in [place]. The niece is concerned about his "compulsive" behavior of stealing, lying (stating that the things are his possession), punching pictures of magazines, cutting in hundreds of pieces with scissors, which was recorded in video camera at her house. He also has history of "mean" to animals for years; grabbing a cat's tail. He tends to be verbally aggressive at times. He has insomnia. He has no known alcohol use or drug use.   Associated Signs/Symptoms: Depression Symptoms:  depressed mood, (Hypo) Manic Symptoms:  Irritable Mood, Anxiety Symptoms:  anxiety Psychotic Symptoms:  Hallucinations: Visual, mouse, cats sin rooms PTSD Symptoms: Negative  Past Psychiatric History:  Outpatient: denies Psychiatry admission: denies Previous suicide attempt: denies Past trials of medication: Paxil, clomipramine, lorazepam, namzaric (donepezil/memantine),  History of violence: denies  Previous Psychotropic Medications: Yes   Substance Abuse History in the last 12 months:  No.  Consequences of Substance Abuse: NA  Past Medical History:  Past Medical History:  Diagnosis Date  . Anemia   . Anxiety   . Arthritis    knees  . Cataract   . Depression   . HTN (hypertension)   . Hyperlipidemia     Past Surgical History:  Procedure Laterality Date  . adenomatous polypectomy  2011  . ANTERIOR CRUCIATE LIGAMENT REPAIR Left   . CATARACT EXTRACTION W/PHACO Right 06/14/2014   Procedure: CATARACT EXTRACTION PHACO AND INTRAOCULAR LENS PLACEMENT  RIGHT EYE;  Surgeon: Tonny Branch, MD;  Location: [facility];  Service: Ophthalmology;  Laterality: Right;  CDE 5.47  . CATARACT EXTRACTION W/PHACO Left 07/12/2014   Procedure: CATARACT EXTRACTION PHACO AND INTRAOCULAR LENS PLACEMENT: CDE:  5.63;  Surgeon: Tonny Branch, MD;  Location: [facility];  Service: Ophthalmology;  Laterality: Left;  . HERNIA REPAIR Left    Inguinal  . TONSILLECTOMY    . TOTAL HIP ARTHROPLASTY Right 07/11/2015   Procedure: TOTAL HIP ARTHROPLASTY ANTERIOR APPROACH;  Surgeon: Mcarthur Rossetti, MD;  Location: [facility];  Service: Orthopedics;  Laterality: Right;    Family Psychiatric History:  Sister- bipolar disorder, dementia (diagnosed at age 77)  Family History:  Family History  Problem Relation Age of Onset  . Stroke Mother   . Heart disease Mother   . Seizures Father   . Heart disease Father   . Mental illness Sister        bipolar  . Bipolar disorder Sister   . Dementia Sister   . COPD Brother   . Heart disease Brother   . Early death Brother 87       sudden death  . Cancer Sister        lung  . Diabetes Neg Hx        only in nephew    Social History:   Social History   Social History  . Marital status: Single    Spouse name: 0  . Number of children: 0  . Years of education: 8   Occupational History  . retired     drove truck, [place] History Main Topics  . Smoking status: Never Smoker  . Smokeless tobacco: Never Used  . Alcohol use Yes     Comment: Occasional beer  . Drug use: No     Comment: 12-07-16 per pt no  . Sexual activity: Not Currently   Other Topics Concern  . 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 atorvastatin (LIPITOR) 40 MG tablet Take 1 tablet (40 mg total) by mouth daily. 90 tablet 3  . clomiPRAMINE (ANAFRANIL) 25 MG capsule TAKE 2 CAPSULES(50 MG) BY MOUTH AT BEDTIME 60 capsule 0  . MELATONIN PO Take by mouth.    Marland Kitchen PARoxetine (PAXIL) 20 MG tablet TAKE 1 TABLET(20 MG) BY MOUTH DAILY 30 tablet 2  . QUEtiapine (SEROQUEL) 25 MG tablet Take 1 tablet (25 mg total) by  mouth at bedtime. 30 tablet 0   No current facility-administered medications for this visit.     Neurologic: Headache: No Seizure: No Paresthesias:No  Musculoskeletal: Strength & Muscle Tone: within normal limits Gait & Station: normal Patient leans: N/A  Psychiatric Specialty Exam: ROS  Blood pressure (!) 160/88, pulse 100, height 5\' 9"  (1.753 m), weight 182 lb 12.8 oz (82.9 kg).Body mass index is 26.99 kg/m.  General Appearance: Fairly Groomed  Eye Contact:  Fair  Speech:  mumbles  Volume:  Normal  Mood:  Depressed  Affect:  Blunt  Thought Process:  Linear  Orientation:  Other:  oriented to self. "2008, 10/1, cannot recall date, "[employer] Content:  denies paranoia reportedly has [place] per family  Suicidal Thoughts:  No  Homicidal Thoughts:  No  Memory:  Immediate;   Poor Recent;   Poor Remote;   Poor  Judgement:  Impaired  Insight:  Lacking  Psychomotor Activity:  Normal  Concentration:  Concentration: Poor and Attention Span: Poor  Recall:  Good  Fund of Knowledge:Good  Language: Good  Akathisia:  No  Handed:  Right  AIMS (if indicated):  N/A  Assets:  Communication Skills Desire for Improvement  ADL's:  Intact  Cognition: WNL  Sleep:  poor  Clock draw: 0/3 (dos not write 11, put 01-07-10 instead of clock hands) Delayed recall 1/3 ("Cat,  cable")    Assessment DAILAN PFALZGRAF is a 83 year old male with OCD, hypertension, dyslipidemia, history of femoral neck fracture due to mechanical fall, who is referred for OCD.  # mood disorder # r.o OCD He does have history of OCD per family and he endorses some neurovegetative symptoms, although he does not elaborate it. Will start quetiapine as adjunctive treatment for depression/anxiety and also to target hallucinations. Discussed metabolic side effects.   # r/o neurocognitive disorder Exam is notable for blunt affect and circumstantial/derailment thought process. Although he does have deficits based on mini-cog, it is very difficult to discern whether there is any intentional component as his family describes. Will do MOCA at the next visit. Will consider referral to neuropsych evaluation as needed.   Plan  1. Start quetiapine 25 mg at night 2. Return to clinic in one month for 30 mins - obtain TSH, folate, vitamin B12 at the next visit - he is on Paxil, clomipramine prescribed by PCP  The patient demonstrates the following risk factors for suicide: Chronic risk factors for suicide include: psychiatric disorder of anxiety. Acute risk factors for suicide include: unemployment and social withdrawal/isolation. Protective factors for this patient include: hope for the future. Considering these factors, the overall suicide risk at this point appears to be low. Patient is appropriate for outpatient follow up.   Treatment Plan Summary: Plan as above   [name], MD 10/22/20182:51 PM

## 2016-12-07 ENCOUNTER — Ambulatory Visit (INDEPENDENT_AMBULATORY_CARE_PROVIDER_SITE_OTHER): Payer: Medicare HMO | Admitting: Psychiatry

## 2016-12-07 ENCOUNTER — Encounter (HOSPITAL_COMMUNITY): Payer: Self-pay | Admitting: Psychiatry

## 2016-12-07 VITALS — BP 160/88 | HR 100 | Ht 69.0 in | Wt 182.8 lb

## 2016-12-07 DIAGNOSIS — Z79899 Other long term (current) drug therapy: Secondary | ICD-10-CM | POA: Diagnosis not present

## 2016-12-07 DIAGNOSIS — F429 Obsessive-compulsive disorder, unspecified: Secondary | ICD-10-CM | POA: Diagnosis not present

## 2016-12-07 DIAGNOSIS — Z818 Family history of other mental and behavioral disorders: Secondary | ICD-10-CM

## 2016-12-07 DIAGNOSIS — Z87828 Personal history of other (healed) physical injury and trauma: Secondary | ICD-10-CM

## 2016-12-07 DIAGNOSIS — F39 Unspecified mood [affective] disorder: Secondary | ICD-10-CM | POA: Diagnosis not present

## 2016-12-07 DIAGNOSIS — I10 Essential (primary) hypertension: Secondary | ICD-10-CM

## 2016-12-07 DIAGNOSIS — G4700 Insomnia, unspecified: Secondary | ICD-10-CM

## 2016-12-07 DIAGNOSIS — E785 Hyperlipidemia, unspecified: Secondary | ICD-10-CM | POA: Diagnosis not present

## 2016-12-07 MED ORDER — QUETIAPINE FUMARATE 25 MG PO TABS: 25.0000 mg | ORAL_TABLET | Freq: Every day | ORAL | 0 refills | Status: DC

## 2016-12-07 NOTE — Patient Instructions (Signed)
1. Start quetiapine 25 mg at night 2. Return to clinic in one month for 30 mins

## 2016-12-24 ENCOUNTER — Ambulatory Visit (INDEPENDENT_AMBULATORY_CARE_PROVIDER_SITE_OTHER): Payer: Medicare HMO | Admitting: Family Medicine

## 2016-12-24 ENCOUNTER — Encounter: Payer: Self-pay | Admitting: Family Medicine

## 2016-12-24 ENCOUNTER — Other Ambulatory Visit: Payer: Self-pay

## 2016-12-24 VITALS — BP 136/80 | HR 88 | Temp 97.9°F | Resp 18 | Ht 69.0 in | Wt 187.0 lb

## 2016-12-24 DIAGNOSIS — F422 Mixed obsessional thoughts and acts: Secondary | ICD-10-CM | POA: Diagnosis not present

## 2016-12-24 DIAGNOSIS — I10 Essential (primary) hypertension: Secondary | ICD-10-CM

## 2016-12-24 MED ORDER — QUETIAPINE FUMARATE 25 MG PO TABS: 25.0000 mg | ORAL_TABLET | Freq: Every day | ORAL | 1 refills | Status: DC

## 2016-12-24 MED ORDER — CLOMIPRAMINE HCL 25 MG PO CAPS: 50.0000 mg | ORAL_CAPSULE | Freq: Every day | ORAL | 0 refills | Status: DC

## 2016-12-24 NOTE — Progress Notes (Signed)
Chief Complaint  Patient presents with  . Follow-up    1 month   Shawn Gomez is back for follow-up.  He is accompanied again by his sister.  He continues to live in the assisted living center.  He is slowly adjusting to the change in his living status.  He states he does not like the food.  He states he does not like to socialize with the other people there.  He has gained weight.  Reports from his sister indicate that he is settling in and that his behavior is improving.  He was seen by Dr. Modesta Messing in psychiatry.  She added quetiapine 25 mg a day to his regimen.  This appears to be helping to calm him.  They have a follow-up on November 27. His blood pressure is well controlled.  He is compliant with his medicines.  He is taking his Lipitor.  Lipids were well controlled when last checked in July. He has had his flu shot.  He is up-to-date with health testing.  No illness, falls, problems to report  Patient Active Problem List   Diagnosis Date Noted  . Mood disorder ([place]) 12/07/2016  . OCD (obsessive compulsive disorder) 09/10/2016  . Need for assistance due to unsteady gait 09/10/2016  . Anxiety state 07/16/2015  . Hyperlipidemia 07/11/2015  . Essential hypertension 07/11/2015  . ADENOMATOUS COLONIC POLYP 05/02/2009    Outpatient Encounter Medications as of 12/24/2016  Medication Sig  . atorvastatin (LIPITOR) 40 MG tablet Take 1 tablet (40 mg total) by mouth daily.  . clomiPRAMINE (ANAFRANIL) 25 MG capsule Take 2 capsules (50 mg total) at bedtime by mouth.  . MELATONIN PO Take by mouth.  Marland Kitchen PARoxetine (PAXIL) 20 MG tablet TAKE 1 TABLET(20 MG) BY MOUTH DAILY  . QUEtiapine (SEROQUEL) 25 MG tablet Take 1 tablet (25 mg total) at bedtime by mouth.  . [DISCONTINUED] clomiPRAMINE (ANAFRANIL) 25 MG capsule TAKE 2 CAPSULES(50 MG) BY MOUTH AT BEDTIME  . [DISCONTINUED] QUEtiapine (SEROQUEL) 25 MG tablet Take 1 tablet (25 mg total) by mouth at bedtime.  . [DISCONTINUED] QUEtiapine (SEROQUEL) 25 MG  tablet Take 1 tablet (25 mg total) at bedtime by mouth.   No facility-administered encounter medications on file as of 12/24/2016.     No Known Allergies  Review of Systems  Constitutional: Positive for unexpected weight change. Negative for activity change and appetite change.       Gaining  HENT: Negative for congestion, dental problem and trouble swallowing.   Eyes: Negative for photophobia and visual disturbance.  Respiratory: Negative for cough and choking.   Cardiovascular: Negative for chest pain and leg swelling.  Gastrointestinal: Negative for constipation and diarrhea.  Genitourinary: Negative for difficulty urinating and frequency.  Musculoskeletal: Positive for gait problem. Negative for arthralgias.       Poor balance.  Uses a walker  Neurological: Negative for dizziness and headaches.  Psychiatric/Behavioral: Positive for confusion and sleep disturbance. Negative for behavioral problems, dysphoric mood and suicidal ideas. The patient is nervous/anxious. The patient is not hyperactive.        Improved.     BP 136/80 (BP Location: Left Arm, Patient Position: Sitting, Cuff Size: Normal)   Pulse 88   Temp 97.9 F (36.6 C) (Temporal)   Resp 18   Ht 5\' 9"  (1.753 m)   Wt 187 lb 0.6 oz (84.8 kg)   SpO2 97%   BMI 27.62 kg/m   Physical Exam  Constitutional: He appears well-developed and well-nourished. No distress.  HENT:  Head: Normocephalic and atraumatic.  Mouth/Throat: Oropharynx is clear and moist.  Eyes: Conjunctivae are normal. Pupils are equal, round, and reactive to light.  Cardiovascular: Normal rate, regular rhythm and normal heart sounds.  Pulmonary/Chest: Effort normal and breath sounds normal.  Neurological: He is alert. Coordination abnormal.  Psychiatric:  Inattentive.  Poor eye contact.  Poor judgement.  Hesitant speech.  Poor recall    ASSESSMENT/PLAN:  1. Mixed obsessional thoughts and acts Possible early dementia.  Dr. Modesta Messing indicated in her  records she is going to do a [place] cognitive assessment at his next visit.  2. Essential hypertension Controlled.  Hyperlipidemia controlled.   Patient Instructions  Se me in 3 months Call sooner for problems or medicine refills    Raylene Everts, MD

## 2016-12-24 NOTE — Patient Instructions (Signed)
Se me in 3 months Call sooner for problems or medicine refills
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
## 2017-01-12 ENCOUNTER — Ambulatory Visit (INDEPENDENT_AMBULATORY_CARE_PROVIDER_SITE_OTHER): Payer: Medicare HMO | Admitting: Psychiatry

## 2017-01-12 ENCOUNTER — Encounter (HOSPITAL_COMMUNITY): Payer: Self-pay | Admitting: Psychiatry

## 2017-01-12 VITALS — BP 140/82 | HR 78 | Ht 69.0 in | Wt 188.0 lb

## 2017-01-12 DIAGNOSIS — F39 Unspecified mood [affective] disorder: Secondary | ICD-10-CM

## 2017-01-12 DIAGNOSIS — Z818 Family history of other mental and behavioral disorders: Secondary | ICD-10-CM | POA: Diagnosis not present

## 2017-01-12 DIAGNOSIS — R419 Unspecified symptoms and signs involving cognitive functions and awareness: Secondary | ICD-10-CM | POA: Insufficient documentation

## 2017-01-12 DIAGNOSIS — E785 Hyperlipidemia, unspecified: Secondary | ICD-10-CM

## 2017-01-12 DIAGNOSIS — G4700 Insomnia, unspecified: Secondary | ICD-10-CM

## 2017-01-12 DIAGNOSIS — Z810 Family history of intellectual disabilities: Secondary | ICD-10-CM | POA: Diagnosis not present

## 2017-01-12 DIAGNOSIS — I10 Essential (primary) hypertension: Secondary | ICD-10-CM | POA: Diagnosis not present

## 2017-01-12 MED ORDER — QUETIAPINE FUMARATE 50 MG PO TABS: 50.0000 mg | ORAL_TABLET | Freq: Every day | ORAL | 1 refills | Status: DC

## 2017-01-12 MED ORDER — CLOMIPRAMINE HCL 25 MG PO CAPS: 50.0000 mg | ORAL_CAPSULE | Freq: Every day | ORAL | 1 refills | Status: DC

## 2017-01-12 NOTE — Patient Instructions (Signed)
1. Increase quetiapine 50 mg at night  2. Decrease paxil 10 mg daily for one week, then discontinue  3. Continue clomipramine 25 mg daily  4. Obtain TSH, folate, vitamin B12  5. Referral to neuropsychological testing

## 2017-01-13 LAB — VITAMIN B12: Vitamin B-12: 562 pg/mL (ref 200–1100)

## 2017-01-13 LAB — FOLATE: Folate: 15.6 ng/mL

## 2017-01-13 LAB — TSH: TSH: 1.51 mIU/L (ref 0.40–4.50)

## 2017-01-20 ENCOUNTER — Encounter: Payer: Self-pay | Admitting: Psychology

## 2017-01-27 NOTE — Progress Notes (Deleted)
BH MD/PA/NP OP Progress Note  01/27/2017 1:27 PM Shawn Gomez  MRN:  [id number]  Chief Complaint:  HPI: *** Visit Diagnosis: No diagnosis found.  Past Psychiatric History:  I have reviewed the patient's psychiatry history in detail and updated the patient record. [place] [facility] admission:denies Previous suicide attempt:denies Past trials of medication:Paxil, clomipramine, lorazepam, namzaric (donepezil/memantine), History of violence:denies  Past Medical History:  Past Medical History:  Diagnosis Date  . Anemia   . Anxiety   . Arthritis    knees  . Cataract   . Depression   . HTN (hypertension)   . Hyperlipidemia     Past Surgical History:  Procedure Laterality Date  . adenomatous polypectomy  2011  . ANTERIOR CRUCIATE LIGAMENT REPAIR Left   . CATARACT EXTRACTION W/PHACO Right 06/14/2014   Procedure: CATARACT EXTRACTION PHACO AND INTRAOCULAR LENS PLACEMENT RIGHT EYE;  Surgeon: Tonny Branch, MD;  Location: [facility];  Service: Ophthalmology;  Laterality: Right;  CDE 5.47  . CATARACT EXTRACTION W/PHACO Left 07/12/2014   Procedure: CATARACT EXTRACTION PHACO AND INTRAOCULAR LENS PLACEMENT: CDE:  5.63;  Surgeon: Tonny Branch, MD;  Location: [facility];  Service: Ophthalmology;  Laterality: Left;  . HERNIA REPAIR Left    Inguinal  . TONSILLECTOMY    . TOTAL HIP ARTHROPLASTY Right 07/11/2015   Procedure: TOTAL HIP ARTHROPLASTY ANTERIOR APPROACH;  Surgeon: Mcarthur Rossetti, MD;  Location: [facility];  Service: Orthopedics;  Laterality: Right;    Family Psychiatric History:  I have reviewed the patient's family history in detail and updated the patient record.  Family History:  Family History  Problem Relation Age of Onset  . Stroke Mother   . Heart disease Mother   . Seizures Father   . Heart disease Father   . Mental illness Sister        bipolar  . Bipolar disorder Sister   . Dementia Sister   . COPD Brother   . Heart disease Brother   . Early death  Brother 15       sudden death  . Cancer Sister        lung  . Diabetes Neg Hx        only in nephew    Social History:  Social History   Socioeconomic History  . Marital status: Single    Spouse name: 0  . Number of children: 0  . Years of education: 8  . Highest education level: Not on file  Social Needs  . Financial resource strain: Not on file  . Food insecurity - worry: Not on file  . Food insecurity - inability: Not on file  . Transportation needs - medical: Not on file  . Transportation needs - non-medical: Not on file  Occupational History  . Occupation: retired    Comment: drove truck, [employer]  Tobacco Use  . Smoking status: Never Smoker  . Smokeless tobacco: Never Used  Substance and Sexual Activity  . Alcohol use: Yes    Comment: Occasional beer  . Drug use: No    Comment: 12-07-16 per pt no  . Sexual activity: Not Currently  Other Topics Concern  . Not on file  Social History Narrative   Moved out of family home   Living in assisted living center    Allergies: No Known Allergies  Metabolic Disorder Labs: No results found for: HGBA1C, MPG No results found for: PROLACTIN Lab Results  Component Value Date   CHOL 166 09/10/2016   TRIG 45 09/10/2016  HDL 85 09/10/2016   CHOLHDL 2.0 09/10/2016   VLDL 9 09/10/2016   LDLCALC 72 09/10/2016   Lab Results  Component Value Date   TSH 1.51 01/12/2017    Therapeutic Level Labs: No results found for: LITHIUM No results found for: VALPROATE No components found for:  CBMZ  Current Medications: Current Outpatient Medications  Medication Sig Dispense Refill  . atorvastatin (LIPITOR) 40 MG tablet Take 1 tablet (40 mg total) by mouth daily. 90 tablet 3  . clomiPRAMINE (ANAFRANIL) 25 MG capsule Take 2 capsules (50 mg total) by mouth at bedtime. 60 capsule 1  . MELATONIN PO Take by mouth.    Marland Kitchen PARoxetine (PAXIL) 20 MG tablet TAKE 1 TABLET(20 MG) BY MOUTH DAILY 30 tablet 2  . QUEtiapine (SEROQUEL) 50 MG  tablet Take 1 tablet (50 mg total) by mouth at bedtime. 30 tablet 1   No current facility-administered medications for this visit.      Musculoskeletal: Strength & Muscle Tone: within normal limits Gait & Station: normal Patient leans: N/A  Psychiatric Specialty Exam: ROS  There were no vitals taken for this visit.There is no height or weight on file to calculate BMI.  General Appearance: Fairly Groomed  Eye Contact:  Good  Speech:  Clear and Coherent  Volume:  Normal  Mood:  {BHH MOOD:22306}  Affect:  {Affect (PAA):22687}  Thought Process:  Coherent and Goal Directed  Orientation:  Full (Time, Place, and Person)  Thought Content: {Thought Content:22690}   Suicidal Thoughts:  {ST/HT (PAA):22692}  Homicidal Thoughts:  {ST/HT (PAA):22692}  Memory:  Immediate;   Good Recent;   Good Remote;   Good  Judgement:  {Judgement (PAA):22694}  Insight:  {Insight (PAA):22695}  Psychomotor Activity:  Normal  Concentration:  Concentration: Good and Attention Span: Good  Recall:  Good  Fund of Knowledge: Good  Language: Good  Akathisia:  No  Handed:  Right  AIMS (if indicated): not done  Assets:  Communication Skills Desire for Improvement  ADL's:  Intact  Cognition: WNL  Sleep:  {BHH GOOD/FAIR/POOR:22877}   Screenings: PHQ2-9     Office Visit from 12/24/2016 in [facility] Office Visit from 09/10/2016 in [facility]  PHQ-2 Total Score  0  0       Assessment and Plan:  Shawn Gomez is a 83 y.o. year old male with a history of mood disorder, hypertension, dyslipidemia, history of femoral neck fracture due to mechanical fall  , who presents for follow up appointment for No diagnosis found.  # Mood disorder # r/o OCD   Per family report, patient does have history of OCD and has some ongoing symptoms of obsession and compulsion. Will uptitrate quetiapine to target his symptoms. Discussed metabolic side effect. Will continue clomipramine for anxiety.  Will taper off paxil given potential interaction with clomipramine and its anticholinergic side effect. Will consider adding other SSRI if any worsening in his symptoms.   # r/o neurocognitive disorder Exam is notable for blood affect and circumstantial,  tangential thought process.  Although he does have some cognitive deficits as evidenced in [facility], there is some questionable volitional component per family report (he reportedly disclosed them that he intentionally did not answer questions on exam). Will make referral to neuropsych testing for further evaluation.   Plan 1. Increase quetiapine 50 mg at night  2. Decrease paxil 10 mg daily for one week, then discontinue  3. Continue clomipramine 25 mg daily  4. Obtain TSH, folate, vitamin B12  5. Referral to neuropsychological testing  The patient demonstrates the following risk factors for suicide: Chronic risk factors for suicide include:psychiatric disorder ofanxiety. Acute risk factorsfor suicide include: unemployment and social withdrawal/isolation. Protective factorsfor this patient include: hope for the future. Considering these factors, the overall suicide risk at this point appears to below. Patientisappropriate for outpatient follow up.     [name], MD 01/27/2017, 1:27 PM

## 2017-02-01 ENCOUNTER — Ambulatory Visit (HOSPITAL_COMMUNITY): Payer: Self-pay | Admitting: Psychiatry

## 2017-02-15 ENCOUNTER — Ambulatory Visit (HOSPITAL_COMMUNITY): Payer: Self-pay | Admitting: Psychiatry

## 2017-03-05 ENCOUNTER — Encounter: Payer: Self-pay | Admitting: Family Medicine

## 2017-03-05 ENCOUNTER — Ambulatory Visit (INDEPENDENT_AMBULATORY_CARE_PROVIDER_SITE_OTHER): Payer: Medicare HMO | Admitting: Family Medicine

## 2017-03-05 ENCOUNTER — Other Ambulatory Visit: Payer: Self-pay

## 2017-03-05 VITALS — BP 148/96 | HR 88 | Temp 98.7°F | Resp 18 | Ht 69.0 in | Wt 192.1 lb

## 2017-03-05 DIAGNOSIS — F422 Mixed obsessional thoughts and acts: Secondary | ICD-10-CM | POA: Diagnosis not present

## 2017-03-05 DIAGNOSIS — B351 Tinea unguium: Secondary | ICD-10-CM | POA: Diagnosis not present

## 2017-03-05 DIAGNOSIS — M79609 Pain in unspecified limb: Secondary | ICD-10-CM

## 2017-03-05 DIAGNOSIS — R05 Cough: Secondary | ICD-10-CM | POA: Diagnosis not present

## 2017-03-05 DIAGNOSIS — R059 Cough, unspecified: Secondary | ICD-10-CM

## 2017-03-05 MED ORDER — BENZONATATE 100 MG PO CAPS
ORAL_CAPSULE | ORAL | 0 refills | Status: DC
Start: 2017-03-05 — End: 2017-05-24

## 2017-03-05 MED ORDER — QUETIAPINE FUMARATE 50 MG PO TABS: 50.0000 mg | ORAL_TABLET | Freq: Every day | ORAL | 1 refills | Status: DC

## 2017-03-05 NOTE — Patient Instructions (Signed)
See a podiatrist 3-4 times a year to manage toenails Take the tessalon twice a day for cough for 7 days Take the seroquel nightly

## 2017-03-05 NOTE — Progress Notes (Signed)
Chief Complaint  Patient presents with  . URI   Patient is brought in by his sister.  They have a couple of problems.  First, his aide said he has been coughing.  He does live in a supervised home.  He does not complain of any cough, sputum, fever chills, chest pain.  He feels well. They also mentioned that his toes need attention.  Although the sister notices that his feet "do not look good."  He denies any foot pain or problems.  He states he does his own grooming and toenails. Sister is out of his Seroquel.  She states she has been having difficulty reaching his treating psychiatrist.  I will refill it for 30 days.  Patient Active Problem List   Diagnosis Date Noted  . Neurocognitive disorder 01/12/2017  . Mood disorder ([place]) 12/07/2016  . OCD (obsessive compulsive disorder) 09/10/2016  . Need for assistance due to unsteady gait 09/10/2016  . Anxiety state 07/16/2015  . Hyperlipidemia 07/11/2015  . Essential hypertension 07/11/2015  . ADENOMATOUS COLONIC POLYP 05/02/2009    Outpatient Encounter Medications as of 03/05/2017  Medication Sig  . atorvastatin (LIPITOR) 40 MG tablet Take 1 tablet (40 mg total) by mouth daily.  . clomiPRAMINE (ANAFRANIL) 25 MG capsule Take 2 capsules (50 mg total) by mouth at bedtime.  Marland Kitchen MELATONIN PO Take by mouth.  . QUEtiapine (SEROQUEL) 50 MG tablet Take 1 tablet (50 mg total) by mouth at bedtime.  . benzonatate (TESSALON) 100 MG capsule I capsule twice a day for one week for cough   No facility-administered encounter medications on file as of 03/05/2017.     No Known Allergies  Review of Systems  Respiratory: Positive for cough.   Skin: Positive for color change.  All other systems reviewed and are negative. Unable to do a true review of systems because of patient's mental illness   BP (!) 148/96 (BP Location: Right Arm, Patient Position: Sitting, Cuff Size: Normal)   Pulse 88   Temp 98.7 F (37.1 C) (Temporal)   Resp 18   Ht 5\' 9"   (1.753 m)   Wt 192 lb 1.3 oz (87.1 kg)   SpO2 95%   BMI 28.37 kg/m   Physical Exam  Constitutional: He appears well-developed and well-nourished. No distress.  HENT:  Head: Normocephalic and atraumatic.  Right Ear: External ear normal.  Left Ear: External ear normal.  Mouth/Throat: Oropharynx is clear and moist.  Eyes: Conjunctivae are normal. Pupils are equal, round, and reactive to light.  Cardiovascular: Normal rate, regular rhythm and normal heart sounds.  Pulmonary/Chest: Effort normal. He has wheezes.  Left base with few wheeze and rhonchi that clear with cough  Neurological: He is alert. Coordination abnormal.  Skin:  Feet are examined.  He has diminished pedal pulses, but 1+ bilaterally.  Slight dusky coloration.  All 10 toenails are thickened, dystrophic, long.  With palpation his great toes are mildly tender.  Psychiatric:  Inattentive.  Poor eye contact.  Poor judgement.  Hesitant speech.  Poor recall    ASSESSMENT/PLAN:  1. Pain due to onychomycosis of nail He needs a podiatrist to cut his nails on a regular basis.  2. Cough Patient denies complaint although he does have some lung findings.  I will give him 1 week of Tessalon to see if this helps.  He cannot be trusted with the as needed medicine so will be given twice a day.  3. Mixed obsessional thoughts and acts Under care of  psychiatry.  Refill Seroquel 50 a day #30   Patient Instructions  See a podiatrist 3-4 times a year to manage toenails Take the tessalon twice a day for cough for 7 days Take the seroquel nightly    Raylene Everts, MD

## 2017-03-08 ENCOUNTER — Ambulatory Visit: Payer: Self-pay | Admitting: Family Medicine

## 2017-03-10 ENCOUNTER — Ambulatory Visit (HOSPITAL_COMMUNITY): Payer: Self-pay | Admitting: Psychiatry

## 2017-03-16 NOTE — Progress Notes (Signed)
BH MD/PA/NP OP Progress Note  03/22/2017 5:03 PM Shawn Gomez  MRN:  [id number]  Chief Complaint:  Chief Complaint    Anxiety; Follow-up     HPI:  Patient presents 10 mins late for the follow up appointment for anxiety.  Patient is a very poor historian and is unable to elaborate the story. He states that "I get involved with foot ball game.Marland Kitchenand go take out of schedule" when he is asked about his regular day. When he is asked about SI, he states that "more murder" and giggles afterwards, stating that he is "kidding." He denies HI on further clarification. He denies insomnia. He denies AH, VH.   His sister and his niece presents to the interview.  The patient appears to put things more organized, although it may be due to him having fewer belongings. They were told by staff who provides hs medication that he is doing better. However, they are still concerned about his anxiety, stating that it might be escalating to violence. He appears to be frustrated more when he misplaces things. He had threatened his family members in the past, although there is no significant violence. They believe the patient states he saw a cat/hallucination, although the patient states that he was "kidding." They cancelled neuropsych testing as they thought it is not necessary and also has difficult time for transportation due to her niece will be getting cardiac surgery.   Visit Diagnosis:    ICD-10-CM   1. Neurocognitive disorder R41.9   2. Mood disorder ([place]) F39     Past Psychiatric History:  I have reviewed the patient's psychiatry history in detail and updated the patient record. [place] [facility] admission:denies Previous suicide attempt:denies Past trials of medication:Paxil, clomipramine, lorazepam, namzaric (donepezil/memantine), History of violence:denies  Past Medical History:  Past Medical History:  Diagnosis Date  . Anemia   . Anxiety   . Arthritis    knees  . Cataract    . Depression   . HTN (hypertension)   . Hyperlipidemia     Past Surgical History:  Procedure Laterality Date  . adenomatous polypectomy  2011  . ANTERIOR CRUCIATE LIGAMENT REPAIR Left   . CATARACT EXTRACTION W/PHACO Right 06/14/2014   Procedure: CATARACT EXTRACTION PHACO AND INTRAOCULAR LENS PLACEMENT RIGHT EYE;  Surgeon: Tonny Branch, MD;  Location: [facility];  Service: Ophthalmology;  Laterality: Right;  CDE 5.47  . CATARACT EXTRACTION W/PHACO Left 07/12/2014   Procedure: CATARACT EXTRACTION PHACO AND INTRAOCULAR LENS PLACEMENT: CDE:  5.63;  Surgeon: Tonny Branch, MD;  Location: [facility];  Service: Ophthalmology;  Laterality: Left;  . HERNIA REPAIR Left    Inguinal  . TONSILLECTOMY    . TOTAL HIP ARTHROPLASTY Right 07/11/2015   Procedure: TOTAL HIP ARTHROPLASTY ANTERIOR APPROACH;  Surgeon: Mcarthur Rossetti, MD;  Location: [facility];  Service: Orthopedics;  Laterality: Right;    Family Psychiatric History:  I have reviewed the patient's family history in detail and updated the patient record.  Family History:  Family History  Problem Relation Age of Onset  . Stroke Mother   . Heart disease Mother   . Seizures Father   . Heart disease Father   . Mental illness Sister        bipolar  . Bipolar disorder Sister   . Dementia Sister   . COPD Brother   . Heart disease Brother   . Early death Brother 72       sudden death  . Cancer Sister  lung  . Diabetes Neg Hx        only in nephew    Social History:  Social History   Socioeconomic History  . Marital status: Single    Spouse name: 0  . Number of children: 0  . Years of education: 8  . Highest education level: None  Social Needs  . Financial resource strain: None  . Food insecurity - worry: None  . Food insecurity - inability: None  . Transportation needs - medical: None  . Transportation needs - non-medical: None  Occupational History  . Occupation: retired    Comment: drove truck, [employer]  Tobacco Use  .  Smoking status: Never Smoker  . Smokeless tobacco: Never Used  Substance and Sexual Activity  . Alcohol use: Yes    Comment: Occasional beer  . Drug use: No    Comment: 12-07-16 per pt no  . Sexual activity: Not Currently  Other Topics Concern  . None  Social History Narrative   Moved out of family home   Living in assisted living center    Allergies: No Known Allergies  Metabolic Disorder Labs: No results found for: HGBA1C, MPG No results found for: PROLACTIN Lab Results  Component Value Date   CHOL 166 09/10/2016   TRIG 45 09/10/2016   HDL 85 09/10/2016   CHOLHDL 2.0 09/10/2016   VLDL 9 09/10/2016   LDLCALC 72 09/10/2016   Lab Results  Component Value Date   TSH 1.51 01/12/2017    Therapeutic Level Labs: No results found for: LITHIUM No results found for: VALPROATE No components found for:  CBMZ  Current Medications: Current Outpatient Medications  Medication Sig Dispense Refill  . atorvastatin (LIPITOR) 40 MG tablet Take 1 tablet (40 mg total) by mouth daily. 90 tablet 3  . benzonatate (TESSALON) 100 MG capsule I capsule twice a day for one week for cough 14 capsule 0  . MELATONIN PO Take by mouth.    . QUEtiapine (SEROQUEL) 50 MG tablet Take 1 tablet (50 mg total) by mouth at bedtime. 30 tablet 1  . clomiPRAMINE (ANAFRANIL) 75 MG capsule Take 1 capsule (75 mg total) by mouth at bedtime. 30 capsule 1   No current facility-administered medications for this visit.      Musculoskeletal: Strength & Muscle Tone: decreased Gait & Station: unsteady- uses a walker Patient leans: N/A  Psychiatric Specialty Exam: Review of Systems  Psychiatric/Behavioral: Positive for memory loss. Negative for depression, hallucinations, substance abuse and suicidal ideas. The patient has insomnia. The patient is not nervous/anxious.   All other systems reviewed and are negative.   Blood pressure (!) 163/85, pulse 98, height 5\' 9"  (1.753 m), weight 192 lb (87.1 kg), SpO2 95  %.Body mass index is 28.35 kg/m.  General Appearance: Fairly Groomed  Eye Contact:  Good  Speech:  Clear and Coherent  Volume:  Normal  Mood:  "fine"  Affect:  Blunt  Thought Process:  Disorganized  Orientation:  NA  Thought Content: no paranoia   Suicidal Thoughts:  No  Homicidal Thoughts:  No  Memory:  Immediate;   Poor  Judgement:  Impaired  Insight:  Lacking  Psychomotor Activity:  Normal  Concentration:  Concentration: Fair and Attention Span: Fair  Recall:  Poor  Fund of Knowledge: Good  Language: Good  Akathisia:  No  Handed:  Right  AIMS (if indicated): not done  Assets:  Communication Skills Desire for Improvement  ADL's:  Intact  Cognition: WNL  Sleep:  Poor  Screenings: PHQ2-9     Office Visit from 03/05/2017 in [facility] Office Visit from 12/24/2016 in [facility] Office Visit from 09/10/2016 in [facility]  PHQ-2 Total Score  0  0  0     MOCA: 9/30, 12/2016(+2 for clock, +3 for naming, +2 for attention, +1 for abstraction, +1 for orientation)    Assessment and Plan:  FURMAN TRENTMAN is a 84 y.o. year old male with a history of mood disorder,   hypertension, dyslipidemia, history of femoral neck fracture due to mechanical fall , who presents for follow up appointment for Neurocognitive disorder  Mood disorder ([place])  # Mood disorder # r/o OCD Per family report, patient does have a history of OCD and has some ongoing symptoms of obsession and compulsion. There is some improvement in his symptoms after uptitration of quetiapine. will continue current dose of quetiapine to target anxiety and obsession. Discussed metabolic side effect. Will uptitrate clomipramine for anxiety. Discussed potential side effect of constipation, dry mouth.   # r/o neurocognitive disorder Exam is notable for blunt affect and circumstantial, tangential thought process.  Although he does have some cognitive deficits as evidence in [facility], there is  family concern that there may be some questionable volitional component (patient reportedly disclosed them that he intentionally did not answer questions on exam).  Although referral to neuropsych testing is made, the family canceled the appointment for various reason.  Will continue to discuss as needed.  Plan I have reviewed and updated plans as below 1. Continue quetiapine 50 mg at night  2. Continue clomipramine 75 mg at night  3. Return to clinic in two months for 30 mins 4. Referral to neuropsychological testing in the future 5. Reviewed TSH, folate, vitamin B12 - wnl  The patient demonstrates the following risk factors for suicide: Chronic risk factors for suicide include:psychiatric disorder ofanxiety. Acute risk factorsfor suicide include: unemployment and social withdrawal/isolation. Protective factorsfor this patient include: hope for the future. Considering these factors, the overall suicide risk at this point appears to below. Patientisappropriate for outpatient follow up.  The duration of this appointment visit was 30 minutes of face-to-face time with the patient.  Greater than 50% of this time was spent in counseling, explanation of  diagnosis, planning of further management, and coordination of care.  [name], MD 03/22/2017, 5:03 PM

## 2017-03-18 ENCOUNTER — Ambulatory Visit: Payer: Self-pay | Admitting: Psychology

## 2017-03-22 ENCOUNTER — Ambulatory Visit (INDEPENDENT_AMBULATORY_CARE_PROVIDER_SITE_OTHER): Payer: Medicare HMO | Admitting: Psychiatry

## 2017-03-22 ENCOUNTER — Encounter (HOSPITAL_COMMUNITY): Payer: Self-pay | Admitting: Psychiatry

## 2017-03-22 VITALS — BP 163/85 | HR 98 | Ht 69.0 in | Wt 192.0 lb

## 2017-03-22 DIAGNOSIS — Z8659 Personal history of other mental and behavioral disorders: Secondary | ICD-10-CM | POA: Diagnosis not present

## 2017-03-22 DIAGNOSIS — I10 Essential (primary) hypertension: Secondary | ICD-10-CM | POA: Diagnosis not present

## 2017-03-22 DIAGNOSIS — R419 Unspecified symptoms and signs involving cognitive functions and awareness: Secondary | ICD-10-CM | POA: Diagnosis not present

## 2017-03-22 DIAGNOSIS — Z87828 Personal history of other (healed) physical injury and trauma: Secondary | ICD-10-CM | POA: Diagnosis not present

## 2017-03-22 DIAGNOSIS — Z79899 Other long term (current) drug therapy: Secondary | ICD-10-CM | POA: Diagnosis not present

## 2017-03-22 DIAGNOSIS — Z818 Family history of other mental and behavioral disorders: Secondary | ICD-10-CM | POA: Diagnosis not present

## 2017-03-22 DIAGNOSIS — F39 Unspecified mood [affective] disorder: Secondary | ICD-10-CM | POA: Diagnosis not present

## 2017-03-22 DIAGNOSIS — E785 Hyperlipidemia, unspecified: Secondary | ICD-10-CM | POA: Diagnosis not present

## 2017-03-22 MED ORDER — CLOMIPRAMINE HCL 75 MG PO CAPS: 75.0000 mg | ORAL_CAPSULE | Freq: Every day | ORAL | 1 refills | Status: DC

## 2017-03-22 MED ORDER — QUETIAPINE FUMARATE 50 MG PO TABS: 50.0000 mg | ORAL_TABLET | Freq: Every day | ORAL | 1 refills | Status: DC

## 2017-03-22 NOTE — Patient Instructions (Addendum)
1. Continue quetiapine 50 mg at night  2. Continue clomipramine 75 mg at night  3. Return to clinic in two months for 30 mins

## 2017-03-29 ENCOUNTER — Ambulatory Visit: Payer: Self-pay | Admitting: Family Medicine

## 2017-04-26 ENCOUNTER — Encounter: Payer: Self-pay | Admitting: Family Medicine

## 2017-05-04 ENCOUNTER — Ambulatory Visit: Payer: Self-pay | Admitting: Family Medicine

## 2017-05-05 ENCOUNTER — Encounter: Payer: Self-pay | Admitting: Family Medicine

## 2017-05-07 ENCOUNTER — Encounter: Payer: Self-pay | Admitting: Family Medicine

## 2017-05-07 ENCOUNTER — Other Ambulatory Visit: Payer: Self-pay

## 2017-05-07 ENCOUNTER — Ambulatory Visit (INDEPENDENT_AMBULATORY_CARE_PROVIDER_SITE_OTHER): Payer: Medicare HMO | Admitting: Family Medicine

## 2017-05-07 VITALS — BP 142/82 | HR 85 | Temp 98.0°F | Resp 16 | Ht 69.0 in | Wt 190.0 lb

## 2017-05-07 DIAGNOSIS — M79609 Pain in unspecified limb: Secondary | ICD-10-CM

## 2017-05-07 DIAGNOSIS — B351 Tinea unguium: Secondary | ICD-10-CM | POA: Diagnosis not present

## 2017-05-07 DIAGNOSIS — F422 Mixed obsessional thoughts and acts: Secondary | ICD-10-CM | POA: Diagnosis not present

## 2017-05-07 NOTE — Patient Instructions (Signed)
I will see about the neurocognitive testing  You need to see the podiatrist   Need follow up in three months  I can provide a letter of independent living capabilities

## 2017-05-07 NOTE — Progress Notes (Signed)
Chief Complaint  Patient presents with  . Follow-up   Patient is here for routine follow-up He is accompanied by his sister He is still living in an assisted living center The family has had to hire a daily nurse to administer medication They also have hired a home health aide to attend his hygiene 3 days a week  He is referred to psychiatry.  He sees Dr. Modesta Messing.  She recently increase his medication.  She has not yet noted any change in behavior or benefit.  Dr. Modesta Messing recommended neuropsychiatric testing.  The family was unable to take them because of the distance to [facility], personal problems, and a misunderstanding that would be helpful.  They feel like because he "makes up" answers that testing will not be accurate.  I explained to them that neuropsychiatric testing is precisely what is needed to tell which answers are made up, which answers are true, and what his diagnosis and treatment options are.  They wish testing in [place].  He was referred for podiatry because of thickened painful toenails.  When it was time to go he refused.  I explained to him again when he needs to go.  Patient Active Problem List   Diagnosis Date Noted  . Neurocognitive disorder 01/12/2017  . Mood disorder ([place]) 12/07/2016  . OCD (obsessive compulsive disorder) 09/10/2016  . Need for assistance due to unsteady gait 09/10/2016  . Anxiety state 07/16/2015  . Hyperlipidemia 07/11/2015  . Essential hypertension 07/11/2015  . ADENOMATOUS COLONIC POLYP 05/02/2009    Outpatient Encounter Medications as of 05/07/2017  Medication Sig  . atorvastatin (LIPITOR) 40 MG tablet Take 1 tablet (40 mg total) by mouth daily.  . clomiPRAMINE (ANAFRANIL) 75 MG capsule Take 1 capsule (75 mg total) by mouth at bedtime.  Marland Kitchen MELATONIN PO Take by mouth.  . QUEtiapine (SEROQUEL) 50 MG tablet Take 1 tablet (50 mg total) by mouth at bedtime.  . benzonatate (TESSALON) 100 MG capsule I capsule twice a day for one week for  cough (Patient not taking: Reported on 05/07/2017)   No facility-administered encounter medications on file as of 05/07/2017.     No Known Allergies  Review of Systems  Constitutional: Negative for activity change and appetite change.  HENT: Negative for congestion, dental problem and trouble swallowing.   Eyes: Negative for photophobia and visual disturbance.  Respiratory: Negative for cough and choking.   Cardiovascular: Negative for chest pain and leg swelling.  Gastrointestinal: Negative for constipation and diarrhea.  Genitourinary: Negative for difficulty urinating and frequency.  Musculoskeletal: Positive for gait problem. Negative for arthralgias.       Poor balance.  Uses a walker  Skin:       Thickened dystrophic long toenails  Neurological: Negative for dizziness and headaches.  Psychiatric/Behavioral: Positive for confusion and sleep disturbance. Negative for behavioral problems, dysphoric mood and suicidal ideas. The patient is nervous/anxious. The patient is not hyperactive.        Waxes and wanes    BP (!) 142/82 (BP Location: Left Arm, Patient Position: Sitting, Cuff Size: Normal)   Pulse 85   Temp 98 F (36.7 C) (Temporal)   Resp 16   Ht 5\' 9"  (1.753 m)   Wt 190 lb (86.2 kg)   SpO2 100%   BMI 28.06 kg/m   Physical Exam  Constitutional: He appears well-developed and well-nourished. No distress.  HENT:  Head: Normocephalic and atraumatic.  Mouth/Throat: Oropharynx is clear and moist.  Eyes: Pupils are equal,  round, and reactive to light. Conjunctivae are normal.  Cardiovascular: Normal rate, regular rhythm and normal heart sounds.  Pulmonary/Chest: Effort normal and breath sounds normal.  Neurological: He is alert. Coordination abnormal.  Skin:  Toenails of both feet are long dystrophic thickened and curling over.  No infection or paronychia  Psychiatric:  Inattentive.  Poor eye contact.  Poor judgement.  Hesitant speech.  Poor recall     ASSESSMENT/PLAN:  1. Pain due to onychomycosis of nail   2. Mixed obsessional thoughts and acts    Patient Instructions  I will see about the neurocognitive testing  You need to see the podiatrist   Need follow up in three months  I can provide a letter of independent living capabilities       Raylene Everts, MD

## 2017-05-19 ENCOUNTER — Encounter: Payer: Self-pay | Admitting: Family Medicine

## 2017-05-22 ENCOUNTER — Encounter: Payer: Self-pay | Admitting: Family Medicine

## 2017-05-24 ENCOUNTER — Telehealth (HOSPITAL_COMMUNITY): Payer: Self-pay | Admitting: *Deleted

## 2017-05-24 ENCOUNTER — Other Ambulatory Visit (HOSPITAL_COMMUNITY): Payer: Self-pay | Admitting: Psychiatry

## 2017-05-24 ENCOUNTER — Other Ambulatory Visit: Payer: Self-pay | Admitting: Family Medicine

## 2017-05-24 MED ORDER — CLOMIPRAMINE HCL 25 MG PO CAPS: 50.0000 mg | ORAL_CAPSULE | Freq: Every day | ORAL | 1 refills | Status: DC

## 2017-05-24 MED ORDER — QUETIAPINE FUMARATE 50 MG PO TABS: 50.0000 mg | ORAL_TABLET | Freq: Every day | ORAL | 1 refills | Status: DC

## 2017-05-24 MED ORDER — PAROXETINE HCL 10 MG PO TABS: 10.0000 mg | ORAL_TABLET | Freq: Every day | ORAL | 2 refills | Status: AC

## 2017-05-24 NOTE — Telephone Encounter (Signed)
Sister states he does better on combination of paxil, anafranil and seroquel. Anafranil was dropped to 50 mg, paxil restarted at 10 mg and seroquel maintained at 50 mg

## 2017-05-24 NOTE — Telephone Encounter (Signed)
Dr Harrington Challenger  Patient's HealthCare POA stated on last visit 03/22/17 Dr Modesta Messing took him off Paxil. And increased the Anafranil. She informs that he seems to be distant not involve as usual  and is resisting assistance with baths & medication. I asked her what would she like [place] to do And she states to go back on the Paxil. That the information packet states this would be helpful in this type   of situation. When I informed that Dr Modesta Messing is out of the country & that I would be share this with the other Provider. She says well I would like this to handle as soon as possible. I stated this may have to be address further by Dr Modesta Messing once she returns. She replies well I guess if I have to wait.

## 2017-06-17 ENCOUNTER — Ambulatory Visit (HOSPITAL_COMMUNITY): Payer: Self-pay | Admitting: Psychiatry

## 2017-06-17 NOTE — Progress Notes (Signed)
BH MD/PA/NP OP Progress Note  06/21/2017 2:21 PM Shawn Gomez  MRN:  [id number]  Chief Complaint:  Chief Complaint    Follow-up; Anxiety     HPI:  Since the last appointment, medication was changed; Anafranil was dropped to 50 mg, Paxil restarted at 10 mg Patient presents for follow-up appointment for mood disorder and cognitive deficits. He states that he has been feeling lonely. He enjoyed [place] at [place]. When he is asked his daily routine, he states that he tries to go outside and smiles at this note Probation officer. He then continues that "That's where I feel like." When he is asked about the reason for visit, he replies "I don't understand what I do." When he is asked about the year, he paused it a while, then ask "what kind of reaction?"  His sister presents to the interview.  He was not cooperative as he used to on prior medication regimen. His sister believes that he does better with Paxil. They talked with PCP and they are now up for neuro psych testing. She wonders if he is "confused' or just does not want to answer questions. She talks about an example that he did not provide good answer when she asked him the reason for taking away light shading.He also asks her how to use remote control. He is known to be awake at 3 AM and asking for breakfast at the facility. He has been taking away things without any reason.   Wt Readings from Last 3 Encounters:  06/21/17 188 lb (85.3 kg)  05/07/17 190 lb (86.2 kg)  03/22/17 192 lb (87.1 kg)   Visit Diagnosis:    ICD-10-CM   1. Neurocognitive disorder R41.9   2. Mood disorder ([place]) F39     Past Psychiatric History:  I have reviewed the patient's psychiatry history in detail and updated the patient record. [place] Psychiatry admission:denies Previous suicide attempt:denies Past trials of medication:Paxil, clomipramine, lorazepam, namzaric (donepezil/memantine), History of violence:denies   Past Medical History:  Past  Medical History:  Diagnosis Date  . Anemia   . Anxiety   . Arthritis    knees  . Cataract   . Depression   . HTN (hypertension)   . Hyperlipidemia     Past Surgical History:  Procedure Laterality Date  . adenomatous polypectomy  2011  . ANTERIOR CRUCIATE LIGAMENT REPAIR Left   . CATARACT EXTRACTION W/PHACO Right 06/14/2014   Procedure: CATARACT EXTRACTION PHACO AND INTRAOCULAR LENS PLACEMENT RIGHT EYE;  Surgeon: Tonny Branch, MD;  Location: [facility];  Service: Ophthalmology;  Laterality: Right;  CDE 5.47  . CATARACT EXTRACTION W/PHACO Left 07/12/2014   Procedure: CATARACT EXTRACTION PHACO AND INTRAOCULAR LENS PLACEMENT: CDE:  5.63;  Surgeon: Tonny Branch, MD;  Location: [facility];  Service: Ophthalmology;  Laterality: Left;  . HERNIA REPAIR Left    Inguinal  . TONSILLECTOMY    . TOTAL HIP ARTHROPLASTY Right 07/11/2015   Procedure: TOTAL HIP ARTHROPLASTY ANTERIOR APPROACH;  Surgeon: Mcarthur Rossetti, MD;  Location: [facility];  Service: Orthopedics;  Laterality: Right;    Family Psychiatric History: I have reviewed the patient's family history in detail and updated the patient record.  Family History:  Family History  Problem Relation Age of Onset  . Stroke Mother   . Heart disease Mother   . Seizures Father   . Heart disease Father   . Mental illness Sister        bipolar  . Bipolar disorder Sister   . Dementia  Sister   . COPD Brother   . Heart disease Brother   . Early death Brother 81       sudden death  . Cancer Sister        lung  . Diabetes Neg Hx        only in nephew    Social History:  Social History   Socioeconomic History  . Marital status: Single    Spouse name: 0  . Number of children: 0  . Years of education: 8  . Highest education level: Not on file  Occupational History  . Occupation: retired    Comment: drove truck, [employer]  . Financial resource strain: Not on file  . Food insecurity:    Worry: Not on file    Inability: Not on file   . Transportation needs:    Medical: Not on file    Non-medical: Not on file  Tobacco Use  . Smoking status: Never Smoker  . Smokeless tobacco: Never Used  Substance and Sexual Activity  . Alcohol use: Yes    Comment: Occasional beer  . Drug use: No    Comment: 12-07-16 per pt no  . Sexual activity: Not Currently  Lifestyle  . Physical activity:    Days per week: Not on file    Minutes per session: Not on file  . Stress: Not on file  Relationships  . Social connections:    Talks on phone: Not on file    Gets together: Not on file    Attends religious service: Not on file    Active member of club or organization: Not on file    Attends meetings of clubs or organizations: Not on file    Relationship status: Not on file  Other Topics Concern  . Not on file  Social History Narrative   Moved out of family home   Living in assisted living center    Allergies: No Known Allergies  Metabolic Disorder Labs: No results found for: HGBA1C, MPG No results found for: PROLACTIN Lab Results  Component Value Date   CHOL 166 09/10/2016   TRIG 45 09/10/2016   HDL 85 09/10/2016   CHOLHDL 2.0 09/10/2016   VLDL 9 09/10/2016   LDLCALC 72 09/10/2016   Lab Results  Component Value Date   TSH 1.51 01/12/2017    Therapeutic Level Labs: No results found for: LITHIUM No results found for: VALPROATE No components found for:  CBMZ  Current Medications: Current Outpatient Medications  Medication Sig Dispense Refill  . atorvastatin (LIPITOR) 40 MG tablet Take 1 tablet (40 mg total) by mouth daily. 90 tablet 3  . clomiPRAMINE (ANAFRANIL) 25 MG capsule Take 2 capsules (50 mg total) by mouth at bedtime. 60 capsule 0  . MELATONIN PO Take by mouth.    Marland Kitchen PARoxetine (PAXIL) 10 MG tablet Take 1 tablet (10 mg total) by mouth daily. 30 tablet 2  . QUEtiapine (SEROQUEL) 50 MG tablet Take 1 tablet (50 mg total) by mouth at bedtime. 30 tablet 0   No current facility-administered medications for  this visit.      Musculoskeletal: Strength & Muscle Tone: within normal limits Gait & Station: unsteady- uses a walker Patient leans: N/A  Psychiatric Specialty Exam: Review of Systems  Unable to perform ROS: Psychiatric disorder  Psychiatric/Behavioral: Positive for depression and memory loss. Negative for hallucinations, substance abuse and suicidal ideas. The patient is nervous/anxious and has insomnia.   All other systems reviewed and are negative.  Blood pressure 125/76, pulse 94, height 5' 7.5" (1.715 m), weight 188 lb (85.3 kg).Body mass index is 29.01 kg/m.  General Appearance: Fairly Groomed  Eye Contact:  Good  Speech:  Garbled  Volume:  Normal  Mood:  "fine"  Affect:  Blunt  Thought Process:  Descriptions of Associations: Circumstantial  Orientation:  Other:  oriented to DOB  Thought Content: no paranoia   Suicidal Thoughts:  No  Homicidal Thoughts:  No  Memory:  Immediate;   Fair  Judgement:  Fair  Insight:  Present  Psychomotor Activity:  Normal  Concentration:  Concentration: Fair and Attention Span: Fair  Recall:  [employer] of Knowledge: Fair  Language: Good  Akathisia:  No  Handed:  Right  AIMS (if indicated): not done  Assets:  Social Support  ADL's:  Intact  Cognition: Impaired,  Mild  Sleep:  Poor   Screenings: PHQ2-9     Office Visit from 05/07/2017 in [facility] Office Visit from 03/05/2017 in [facility] Office Visit from 12/24/2016 in [facility] Office Visit from 09/10/2016 in [facility]  PHQ-2 Total Score  1  0  0  0     MOCA: 9/30, 12/2016(+2 for clock, +3 for naming, +2 for attention, +1 for abstraction, +1 for orientation)  Assessment and Plan:  Shawn Gomez is a 84 y.o. year old male with a history of mood disorder, hypertension, dyslipidemia, history of femoral neck fracture due to mechanical fall , who presents for follow up appointment for Neurocognitive disorder  Mood disorder  ([place])   r/o neurocognitive disorder Exam is notable for blunt affect and circumstantial, tangential thought process. Past MOCA indicates cognitive deficits, although there is a concern of his family member that there might be some volitional component (patient reportedly disclosed to the family member that he did not answer questions intentionally). He will be scheduled for neuropsych testing; it will be greatly helpful to discern the cause of his mental status.   # Mood disorder # r/o OCD Per collateral, he has some "OCD" symptoms, which includes taking away things, although he does not elaborate it on exam. Although it has been tried to consolidate to clomipramine to target anxiety, the family reports better response when he was on Paxil. Will continue Paxil, clomipramine to target OCD like symptoms. Will continue quetiapine as adjunctive treatment for anxiety, and also for sleep. Discussed metabolic side effect.   Plan I have reviewed and updated plans as below 1. Continue quetiapine 50 mg at night  2. Continue clomipramine 50 mg at night  3. Continue Paxil 10 mg daily  4. Return to clinic in two months for 30 mins 5. Referral to neuropsychological testing in the future 6. Reviewed TSH, folate, vitamin B12- wnl  The patient demonstrates the following risk factors for suicide: Chronic risk factors for suicide include:psychiatric disorder ofanxiety. Acute risk factorsfor suicide include: unemployment and social withdrawal/isolation. Protective factorsfor this patient include: hope for the future. Considering these factors, the overall suicide risk at this point appears to below. Patientisappropriate for outpatient follow up.  The duration of this appointment visit was 30 minutes of face-to-face time with the patient.  Greater than 50% of this time was spent in counseling, explanation of  diagnosis, planning of further management, and coordination of care.  [name],  MD 06/21/2017, 2:21 PM

## 2017-06-21 ENCOUNTER — Encounter (HOSPITAL_COMMUNITY): Payer: Self-pay | Admitting: Psychiatry

## 2017-06-21 ENCOUNTER — Ambulatory Visit (INDEPENDENT_AMBULATORY_CARE_PROVIDER_SITE_OTHER): Payer: Medicare HMO | Admitting: Psychiatry

## 2017-06-21 VITALS — BP 125/76 | HR 94 | Ht 67.5 in | Wt 188.0 lb

## 2017-06-21 DIAGNOSIS — F419 Anxiety disorder, unspecified: Secondary | ICD-10-CM

## 2017-06-21 DIAGNOSIS — G4700 Insomnia, unspecified: Secondary | ICD-10-CM

## 2017-06-21 DIAGNOSIS — F39 Unspecified mood [affective] disorder: Secondary | ICD-10-CM | POA: Diagnosis not present

## 2017-06-21 DIAGNOSIS — R419 Unspecified symptoms and signs involving cognitive functions and awareness: Secondary | ICD-10-CM | POA: Diagnosis not present

## 2017-06-21 DIAGNOSIS — Z818 Family history of other mental and behavioral disorders: Secondary | ICD-10-CM | POA: Diagnosis not present

## 2017-06-21 DIAGNOSIS — R450 Nervousness: Secondary | ICD-10-CM | POA: Diagnosis not present

## 2017-06-21 DIAGNOSIS — Z810 Family history of intellectual disabilities: Secondary | ICD-10-CM | POA: Diagnosis not present

## 2017-06-21 MED ORDER — CLOMIPRAMINE HCL 25 MG PO CAPS: 50.0000 mg | ORAL_CAPSULE | Freq: Every day | ORAL | 0 refills | Status: AC

## 2017-06-21 MED ORDER — QUETIAPINE FUMARATE 50 MG PO TABS: 50.0000 mg | ORAL_TABLET | Freq: Every day | ORAL | 0 refills | Status: AC

## 2017-06-21 NOTE — Patient Instructions (Signed)
1. Continue quetiapine 50 mg at night  2. Continue clomipramine 50 mg at night  3. Continue Paxil 10 mg daily  4. Return to clinic in two months for 30 mins

## 2017-06-23 ENCOUNTER — Encounter: Payer: Self-pay | Admitting: Family Medicine

## 2017-06-23 NOTE — Telephone Encounter (Signed)
I do not understand this message from the patient. Please see what he needs. Gwen Her. Mannie Stabile, MD

## 2017-06-25 ENCOUNTER — Telehealth: Payer: Self-pay

## 2017-06-25 DIAGNOSIS — F0391 Unspecified dementia with behavioral disturbance: Secondary | ICD-10-CM

## 2017-06-25 DIAGNOSIS — R419 Unspecified symptoms and signs involving cognitive functions and awareness: Secondary | ICD-10-CM

## 2017-06-25 NOTE — Telephone Encounter (Signed)
Spoke with Santiago Glad, [place], and let her know the referral to Dr.[place] had been placed with verbal understanding.

## 2017-06-25 NOTE — Telephone Encounter (Signed)
Please advised that referral to Dr. Merlene Laughter has been placed.

## 2017-06-25 NOTE — Telephone Encounter (Signed)
I was contacted by [facility] last week regarding appointment for Shawn Gomez, however they were not able to see him until sometime in September. I reached out to his insurance company for assistance with in locating doctor who could see him sooner. Several calls put me in touch with Dr. Phillips Odor of [facility]. I am familiar with this practice and contacted them and was assured he can see Shawn Gomez for dementia evaluation much sooner after receiving referral and case information from your office. Possibly this referral can be coordinated with case information from Dr. [name].  Thank you.   REGARDING ABOVE MYCHART MESSAGE: I spoke with Janet Berlin, she stated that this patient is a former patient of Dr.Nelson. She was sending him for further testing for dementia. She didn't want to wait until September.   Dr. Merlene Laughter will be able to perform the testing earlier than that, however, she needs a referral from you, and we will need to send patient information. I explained to her that you had never seen the patient, but I would send a message to Dr.Hagler.

## 2017-07-22 ENCOUNTER — Encounter: Payer: Self-pay | Admitting: Family Medicine

## 2017-07-23 ENCOUNTER — Encounter: Payer: Self-pay | Admitting: Family Medicine

## 2017-08-23 ENCOUNTER — Encounter: Payer: Self-pay | Admitting: Family Medicine

## 2017-08-25 ENCOUNTER — Ambulatory Visit: Payer: Self-pay | Admitting: Family Medicine

## 2017-09-16 DEATH — deceased
